# Patient Record
Sex: Male | Born: 1935
Health system: Southern US, Community
[De-identification: ages and names within clinical notes are randomized; demographics above are authoritative.]

## PROBLEM LIST (undated history)

## (undated) DIAGNOSIS — G4733 Obstructive sleep apnea (adult) (pediatric): Secondary | ICD-10-CM

## (undated) DIAGNOSIS — E6609 Other obesity due to excess calories: Secondary | ICD-10-CM

## (undated) HISTORY — DX: Other obesity due to excess calories: E66.09

## (undated) HISTORY — DX: Obstructive sleep apnea (adult) (pediatric): G47.33

## (undated) HISTORY — PX: KNEE SURGERY: SHX244

---

## 2000-01-09 ENCOUNTER — Encounter: Payer: Self-pay | Admitting: Emergency Medicine

## 2000-01-09 ENCOUNTER — Emergency Department (HOSPITAL_COMMUNITY): Admission: EM | Admit: 2000-01-09 | Discharge: 2000-01-09 | Payer: Self-pay | Admitting: Emergency Medicine

## 2000-01-17 ENCOUNTER — Encounter: Payer: Self-pay | Admitting: Orthopedic Surgery

## 2000-01-17 ENCOUNTER — Ambulatory Visit (HOSPITAL_COMMUNITY): Admission: RE | Admit: 2000-01-17 | Discharge: 2000-01-19 | Payer: Self-pay | Admitting: Orthopedic Surgery

## 2001-09-05 ENCOUNTER — Encounter: Admission: RE | Admit: 2001-09-05 | Discharge: 2001-09-05 | Payer: Self-pay | Admitting: Geriatric Medicine

## 2001-09-05 ENCOUNTER — Encounter: Payer: Self-pay | Admitting: Geriatric Medicine

## 2001-09-12 ENCOUNTER — Ambulatory Visit (HOSPITAL_COMMUNITY): Admission: RE | Admit: 2001-09-12 | Discharge: 2001-09-12 | Payer: Self-pay | Admitting: Geriatric Medicine

## 2001-09-12 ENCOUNTER — Encounter: Admission: RE | Admit: 2001-09-12 | Discharge: 2001-09-12 | Payer: Self-pay | Admitting: Geriatric Medicine

## 2001-09-12 ENCOUNTER — Encounter: Payer: Self-pay | Admitting: Geriatric Medicine

## 2003-01-21 ENCOUNTER — Encounter (INDEPENDENT_AMBULATORY_CARE_PROVIDER_SITE_OTHER): Payer: Self-pay | Admitting: Specialist

## 2003-01-21 ENCOUNTER — Ambulatory Visit (HOSPITAL_COMMUNITY): Admission: RE | Admit: 2003-01-21 | Discharge: 2003-01-21 | Payer: Self-pay | Admitting: Gastroenterology

## 2005-05-11 ENCOUNTER — Ambulatory Visit: Payer: Self-pay | Admitting: Internal Medicine

## 2005-08-10 ENCOUNTER — Ambulatory Visit: Payer: Self-pay | Admitting: Internal Medicine

## 2005-12-12 ENCOUNTER — Ambulatory Visit: Payer: Self-pay | Admitting: Internal Medicine

## 2006-01-03 ENCOUNTER — Ambulatory Visit: Payer: Self-pay | Admitting: Internal Medicine

## 2006-03-12 ENCOUNTER — Ambulatory Visit: Payer: Self-pay | Admitting: Internal Medicine

## 2006-11-19 ENCOUNTER — Ambulatory Visit: Payer: Self-pay | Admitting: Internal Medicine

## 2007-01-27 ENCOUNTER — Ambulatory Visit: Payer: Self-pay | Admitting: Internal Medicine

## 2007-01-27 ENCOUNTER — Telehealth (INDEPENDENT_AMBULATORY_CARE_PROVIDER_SITE_OTHER): Payer: Self-pay | Admitting: *Deleted

## 2007-01-27 DIAGNOSIS — G4733 Obstructive sleep apnea (adult) (pediatric): Secondary | ICD-10-CM

## 2007-01-27 DIAGNOSIS — J309 Allergic rhinitis, unspecified: Secondary | ICD-10-CM | POA: Insufficient documentation

## 2007-01-27 DIAGNOSIS — J449 Chronic obstructive pulmonary disease, unspecified: Secondary | ICD-10-CM

## 2007-02-06 ENCOUNTER — Telehealth (INDEPENDENT_AMBULATORY_CARE_PROVIDER_SITE_OTHER): Payer: Self-pay | Admitting: *Deleted

## 2007-05-26 ENCOUNTER — Ambulatory Visit: Payer: Self-pay | Admitting: Internal Medicine

## 2007-08-18 ENCOUNTER — Telehealth (INDEPENDENT_AMBULATORY_CARE_PROVIDER_SITE_OTHER): Payer: Self-pay | Admitting: *Deleted

## 2007-08-20 ENCOUNTER — Telehealth (INDEPENDENT_AMBULATORY_CARE_PROVIDER_SITE_OTHER): Payer: Self-pay | Admitting: *Deleted

## 2007-11-24 ENCOUNTER — Ambulatory Visit: Payer: Self-pay | Admitting: Internal Medicine

## 2007-11-27 ENCOUNTER — Telehealth: Payer: Self-pay | Admitting: Internal Medicine

## 2007-12-24 ENCOUNTER — Encounter: Payer: Self-pay | Admitting: Internal Medicine

## 2008-05-24 ENCOUNTER — Ambulatory Visit: Payer: Self-pay | Admitting: Internal Medicine

## 2008-09-09 ENCOUNTER — Encounter: Payer: Self-pay | Admitting: Internal Medicine

## 2008-09-09 ENCOUNTER — Telehealth: Payer: Self-pay | Admitting: Internal Medicine

## 2008-09-14 ENCOUNTER — Encounter: Payer: Self-pay | Admitting: Internal Medicine

## 2008-11-22 ENCOUNTER — Ambulatory Visit: Payer: Self-pay | Admitting: Internal Medicine

## 2008-11-28 LAB — CONVERTED CEMR LAB: IgE (Immunoglobulin E), Serum: 63.4 intl units/mL (ref 0.0–180.0)

## 2009-02-08 ENCOUNTER — Ambulatory Visit: Payer: Self-pay | Admitting: Internal Medicine

## 2009-03-07 ENCOUNTER — Ambulatory Visit: Payer: Self-pay | Admitting: Internal Medicine

## 2009-03-23 ENCOUNTER — Ambulatory Visit: Payer: Self-pay | Admitting: Internal Medicine

## 2009-05-26 ENCOUNTER — Telehealth: Payer: Self-pay | Admitting: Internal Medicine

## 2009-05-27 ENCOUNTER — Ambulatory Visit: Payer: Self-pay | Admitting: Internal Medicine

## 2009-08-29 HISTORY — PX: COLONOSCOPY: SHX5424

## 2009-09-29 ENCOUNTER — Ambulatory Visit (HOSPITAL_COMMUNITY): Admission: RE | Admit: 2009-09-29 | Discharge: 2009-09-29 | Payer: Self-pay | Admitting: Gastroenterology

## 2009-11-28 ENCOUNTER — Ambulatory Visit: Payer: Self-pay | Admitting: Internal Medicine

## 2010-02-23 NOTE — Assessment & Plan Note (Signed)
Summary: NP follow up - hemoptysis   Primary Provider/Referring Provider:  Stoneking  CC:  chronic chest congestion and had prod cough with dark red sputum x1 episode 5days ago.  no episodes since.  History of Present Illness: 11/24/07-  Sleep apnea control "doing fine" on BiPAP. Compliant. Working with Christoper Allegra for new mask. Asthma- Dr [name] Rx'd mid summer. Then patient took his own steroid taper last week. Has incr'd to Advair 500. Using systemic steroid 3-4 x/ year.  05/24/08- Asthma, OSA, allergic rhinitis Heavy spring pollen noted, but managing and hasn't needed rescue inhaler lately. [employer] insurance sending him to an exercise/weight program. Takes a prednisone taper usually about once per quarter. Reviewed steroid side effects. Uses Advair 250 once daily. Coughs some white occasionally. rhinorhea.  November 22, 2008- Asthma, OSA, Allergic rhinitis Using BiPAP 10/3, [facility]. He says this is fine and he uses it every night. Comes for PFT review: Normal flows with some airtrapping Fev1/FVC 0.72.  He still goes to [facility] several times/ week. He continues using prednisone taper about once/ quarter in addition to the Advair.   February 08, 2009--Presents for an acute office visit. Complains of chronic chest congestion, had prod cough with dark red sputum x1 episode 5days ago.  Has had no episodes since first episode. Complains of years of aggravating cough, --mainly dry in nature. Has worsened over last week. Cough is minimally productive w/ no discolored mucus. No fever. Does not feel bad, just getting worn out with cough. Denies chest pain,  orthopnea,   fever, n/v/d, edema, headache,recent travel or antibiotics.    Medications Prior to Update: 1)  Lisinopril 20 Mg  Tabs (Lisinopril) .... Take 1 Tablet By Mouth Once A Day 2)  Simvastatin 80 Mg Tabs (Simvastatin) .... Take 1 By Mouth Once Daily 3)  Bayer Low Strength 81 Mg  Tbec (Aspirin) .... Take 1 Tablet By Mouth Once A Day 4)  Flonase 50  Mcg/act  Susp (Fluticasone Propionate) .Marland Kitchen.. 1 Spray Each Nostril Once Daily 5)  Advair Diskus 500-50 Mcg/dose Misc (Fluticasone-Salmeterol) .... Inhale 1 Puff Two Times A Day Rinse Mouth Well 6)  Prednisone 5 Mg  Tabs (Prednisone) .... Burst and Taper As Directed Prn 7)  Bipap [employer]  10/3 8)  Proair Hfa 108 (90 Base) Mcg/act Aers (Albuterol Sulfate) .... 2 Puffs and Rinse Four Times A Day As Needed 9)  Amlodipine Besylate 10 Mg Tabs (Amlodipine Besylate) .... Take 1 By Mouth Once Daily 10)  Furosemide 20 Mg Tabs (Furosemide) .... Once Daily  Current Medications (verified): 1)  Lisinopril 20 Mg  Tabs (Lisinopril) .... Take 1 Tablet By Mouth Once A Day 2)  Simvastatin 80 Mg Tabs (Simvastatin) .... Take 1 By Mouth Once Daily 3)  Bayer Low Strength 81 Mg  Tbec (Aspirin) .... Take 1 Tablet By Mouth Once A Day 4)  Flonase 50 Mcg/act  Susp (Fluticasone Propionate) .Marland Kitchen.. 1 Spray Each Nostril Once Daily 5)  Advair Diskus 500-50 Mcg/dose Misc (Fluticasone-Salmeterol) .... Inhale 1 Puff Two Times A Day Rinse Mouth Well 6)  Bipap [employer]  10/3 .... Wear At Bedtime 7)  Proair Hfa 108 (90 Base) Mcg/act Aers (Albuterol Sulfate) .... 2 Puffs and Rinse Four Times A Day As Needed 8)  Amlodipine Besylate 10 Mg Tabs (Amlodipine Besylate) .... Take 1 By Mouth Once Daily 9)  Furosemide 20 Mg Tabs (Furosemide) .... Once Daily 10)  Omega-3 Fish Oil 1200 Mg Caps (Omega-3 Fatty Acids) .... Take 1 Capsule By Mouth Three Times A  Day  Allergies (verified): No Known Drug Allergies  Past History:  Past Medical History: Last updated: 01/27/2007 OSA- NPSG 03/03/96 AHI 50/hr allergic rhinitis asthma- PFT 01/03/06:  FEV1/FVC .70, small airway slowing with response to bronchodilator exogenous obesity  Past Surgical History: Last updated: 05/24/2008 Right knee surgery  Family History: Last updated: 02/08/2009 asthma - MGF rheumatism - PGM cancer - pat uncle (skin) renal insuf - mother  Social History: Last  updated: 02/08/2009 former smoker, quit in 1985 x72yrs, 2-3ppd. alcohol: 2 beers, 8oz scotch daily divorced 2 children, 5 grandchildren retired: Armed forces operational officer with [employer]  Risk Factors: Smoking Status: quit (05/26/2007)  Family History: asthma - MGF rheumatism - PGM cancer - pat uncle (skin) renal insuf - mother  Social History: former smoker, quit in 1985 x55yrs, 2-3ppd. alcohol: 2 beers, 8oz scotch daily divorced 2 children, 5 grandchildren retired: Armed forces operational officer with [employer]  Review of Systems      See HPI  Vital Signs:  Patient profile:   76 year old male Height:      68 inches Weight:      326.50 pounds BMI:     49.82 O2 Sat:      97 % on Room air Temp:     99.3 degrees F oral Pulse rate:   68 / minute BP sitting:   136 / 74  (left arm) Cuff size:   large  Vitals Entered By: Boone Master CNA (February 08, 2009 10:23 AM)  O2 Flow:  Room air CC: chronic chest congestion, had prod cough with dark red sputum x1 episode 5days ago.  no episodes since Is Patient Diabetic? No Comments Medications reviewed with patient Daytime contact number verified with patient. Boone Master CNA  February 08, 2009 10:23 AM    Physical Exam  Additional Exam:  General: A/Ox3; pleasant and cooperative, NAD, obese SKIN: early stasis rash right pretibial ankles NODES: no lymphadenopathy HEENT: [place]/AT, EOM- WNL, Conjuctivae- clear, PERRLA, TM-WNL, Nose- clear, Throat- clear and wnl NECK: Supple w/ fair ROM, JVD- none, normal carotid impulses w/o bruits Thyroid-  CHEST: Coarse BS w/ upper airway wheeizng.  HEART: RRR, no m/g/r heard ABDOMEN: EAV:WUJW, nl pulses, no edema  NEURO: Grossly intact to observation      Impression & Recommendations:  Problem # 1:  ASTHMA (ICD-493.90) Flare with upper airway cough /inflammation  ?secondary to ACE, vs reflux vs rhinitis.  REC: xray pending.  Stop Lisinopril.  Begin Diovan 320mg  once daily  Delsym 2 tsp twice  daily for cough Tessalon three times a day as needed cough Begin Omeprazole 20mg  once daily before meal.  Hold fish oil for now.  Begin Zyrtec 10mg  at bedtime  Prednisone taper over next week.  I will call with xray results.  Please contact office for sooner follow up if symptoms do not improve or worsen  follow up 4 weeks.   Medications Added to Medication List This Visit: 1)  Diovan 320 Mg Tabs (Valsartan) .Marland Kitchen.. 1 by mouth once daily 2)  Bipap [employer] 10/3  .... Wear at bedtime 3)  Omega-3 Fish Oil 1200 Mg Caps (Omega-3 fatty acids) .... Take 1 capsule by mouth three times a day 4)  Tessalon 200 Mg Caps (Benzonatate) .Marland Kitchen.. 1 by mouth three times a day as needed cough 5)  Prednisone 10 Mg Tabs (Prednisone) .... 4 tabs for 2 days, then 3 tabs for 2 days, 2 tabs for 2 days, then 1 tab for 2 days, then stop  Complete Medication List: 1)  Diovan 320 Mg Tabs (Valsartan) .Marland Kitchen.. 1 by mouth once daily 2)  Simvastatin 80 Mg Tabs (Simvastatin) .... Take 1 by mouth once daily 3)  Bayer Low Strength 81 Mg Tbec (Aspirin) .... Take 1 tablet by mouth once a day 4)  Flonase 50 Mcg/act Susp (Fluticasone propionate) .Marland Kitchen.. 1 spray each nostril once daily 5)  Advair Diskus 500-50 Mcg/dose Misc (Fluticasone-salmeterol) .... Inhale 1 puff two times a day rinse mouth well 6)  Bipap [employer] 10/3  .... Wear at bedtime 7)  Proair Hfa 108 (90 Base) Mcg/act Aers (Albuterol sulfate) .... 2 puffs and rinse four times a day as needed 8)  Amlodipine Besylate 10 Mg Tabs (Amlodipine besylate) .... Take 1 by mouth once daily 9)  Furosemide 20 Mg Tabs (Furosemide) .... Once daily 10)  Omega-3 Fish Oil 1200 Mg Caps (Omega-3 fatty acids) .... Take 1 capsule by mouth three times a day 11)  Tessalon 200 Mg Caps (Benzonatate) .Marland Kitchen.. 1 by mouth three times a day as needed cough 12)  Prednisone 10 Mg Tabs (Prednisone) .... 4 tabs for 2 days, then 3 tabs for 2 days, 2 tabs for 2 days, then 1 tab for 2 days, then stop  Other Orders: T-2  View CXR (71020TC) Est. Patient Level IV (45409)  Patient Instructions: 1)  Stop Lisinopril.  2)  Begin Diovan 320mg  once daily  3)  Delsym 2 tsp twice daily for cough 4)  Tessalon three times a day as needed cough 5)  Begin Omeprazole 20mg  once daily before meal.  6)  Hold fish oil for now.  7)  Begin Zyrtec 10mg  at bedtime  8)  Prednisone taper over next week.  9)  I will call with xray results.  10)  Please contact office for sooner follow up if symptoms do not improve or worsen  11)  follow up 4 weeks.  Prescriptions: PREDNISONE 10 MG TABS (PREDNISONE) 4 tabs for 2 days, then 3 tabs for 2 days, 2 tabs for 2 days, then 1 tab for 2 days, then stop  #20 x 0   Entered and Authorized by:   Rubye Oaks NP   Signed by:   Rubye Oaks NP on 02/08/2009   Method used:   Electronically to        [facility]  [phone]* (retail)       [address] [place], [place]  [address]       Ph: [phone] or [phone]       Fax: [phone]   RxID:   [id number] TESSALON 200 MG CAPS (BENZONATATE) 1 by mouth three times a day as needed cough  #45 x 1   Entered and Authorized by:   Rubye Oaks NP   Signed by:   Rubye Oaks NP on 02/08/2009   Method used:   Electronically to        [facility]  [phone]* (retail)       [address] [place], [place]  [address]       Ph: [phone] or [phone]       Fax: [phone]   RxID:   [id number]

## 2010-02-23 NOTE — Assessment & Plan Note (Signed)
Summary: rov 4 months///kp   Primary Provider/Referring Provider:  [name]  CC:  follow up visit-still wheezing.  History of Present Illness: November 22, 2008- Asthma, OSA, Allergic rhinitis Using BiPAP 10/3, [facility]. He says this is fine and he uses it every night. Comes for PFT review: Normal flows with some airtrapping Fev1/FVC 0.72.  He still goes to [facility] several times/ week. He continues using prednisone taper about once/ quarter in addition to the Advair.   February 08, 2009--Presents for an acute office visit. Complains of chronic chest congestion, had prod cough with dark red sputum x1 episode 5days ago.  Has had no episodes since first episode. Complains of years of aggravating cough, --mainly dry in nature. Has worsened over last week. Cough is minimally productive w/ no discolored mucus. No fever. Does not feel bad, just getting worn out with cough.    March 07, 2009 --1 month follow up, states no longer has the dark red sputum, and has been doing well with the diovan but BPs still elevated.  prod cough with yellow mucus, wheezing, dyspnea x1week Last visit ace stopped w/ cough prevention. CXR last visit w/ chronic changes. cough is better but not completely gone aware and hoarseness. Feels like scratchy throat. Denies chest pain,  orthopnea, hemoptysis, fever,   March 23, 2009- Asthma, OSA, Allergic rhinitis Still less cough since quitting lisinopril. No more blood. Also switched from Advair to Symbicort to avoid the dry powder,which he likes a little better. He finds omeprazole replaces Tums. He doesn't wake choking or strangled and denies choking with food or drink. Still notes some raspy cough from trachea, occasionally productive of yellow. Prednisone worked and almost cleared him . Tessalon caused drowsiness. Uses BIPAP 10/3 every night. PFT reviewed from last Fall- supporting impression of asthma. CXR showed only some hyperinflation despite his obesity- also supporting  asthma.   Current Medications (verified): 1)  Diovan 320 Mg Tabs (Valsartan) .Marland Kitchen.. 1 By Mouth Once Daily 2)  Simvastatin 80 Mg Tabs (Simvastatin) .... Take 1 By Mouth Once Daily 3)  Bayer Low Strength 81 Mg  Tbec (Aspirin) .... Take 1 Tablet By Mouth Once A Day 4)  Flonase 50 Mcg/act  Susp (Fluticasone Propionate) .Marland Kitchen.. 1 Spray Each Nostril Once Daily 5)  Symbicort 160-4.5 Mcg/act Aero (Budesonide-Formoterol Fumarate) .... 2 Puffs Two Times A Day and Rinse 6)  Bipap [employer]  10/3 .... Wear At Bedtime 7)  Proair Hfa 108 (90 Base) Mcg/act Aers (Albuterol Sulfate) .... 2 Puffs and Rinse Four Times A Day As Needed 8)  Amlodipine Besylate 10 Mg Tabs (Amlodipine Besylate) .... Take 1 By Mouth Once Daily 9)  Furosemide 20 Mg Tabs (Furosemide) .... Once Daily  Allergies (verified): No Known Drug Allergies  Past History:  Past Medical History: Last updated: 01/27/2007 OSA- NPSG 03/03/96 AHI 50/hr allergic rhinitis asthma- PFT 01/03/06:  FEV1/FVC .70, small airway slowing with response to bronchodilator exogenous obesity  Past Surgical History: Last updated: 05/24/2008 Right knee surgery  Family History: Last updated: 02/08/2009 asthma - MGF rheumatism - PGM cancer - pat uncle (skin) renal insuf - mother  Social History: Last updated: 02/08/2009 former smoker, quit in 1985 x17yrs, 2-3ppd. alcohol: 2 beers, 8oz scotch daily divorced 2 children, 5 grandchildren retired: Armed forces operational officer with [employer]  Risk Factors: Smoking Status: quit (05/26/2007)  Review of Systems      See HPI       The patient complains of hoarseness, dyspnea on exertion, and prolonged cough.  The patient  denies anorexia, fever, weight loss, weight gain, vision loss, decreased hearing, chest pain, syncope, headaches, hemoptysis, abdominal pain, and severe indigestion/heartburn.    Vital Signs:  Patient profile:   76 year old male Height:      68 inches Weight:      330.25 pounds BMI:     50.40 O2  Sat:      96 % on Room air Pulse rate:   70 / minute BP sitting:   142 / 88  (left arm) Cuff size:   large  Vitals Entered By: Reynaldo Minium CMA (March 23, 2009 9:27 AM)  O2 Flow:  Room air  Physical Exam  Additional Exam:  General: A/Ox3; pleasant and cooperative, NAD, obese SKIN: early stasis rash right pretibial ankles NODES: no lymphadenopathy HEENT: [place]/AT, EOM- WNL, Conjuctivae- clear, PERRLA, TM-WNL, Nose- clear, Throat- clear and wnl, Mallampati  III NECK: Supple w/ fair ROM, JVD- none, normal carotid impulses w/o bruits Thyroid-  CHEST: Coarse BS w/ upper airway wheezing.  HEART: RRR, no m/g/r heard ABDOMEN:soft, obese GNF:AOZH, nl pulses, no edema  NEURO: Grossly intact to observation      Impression & Recommendations:  Problem # 1:  SLEEP APNEA (ICD-780.57)  Good compliance and control on CPAP  Problem # 2:  ASTHMA (ICD-493.90) Available studies favor asthma with coarse wheeze on exam today. Mild osteopenia suggested on CXR and discused with him in connection with steroid use. We would like to boost his inhaled steroid for a while. I will give a sample of Dulera 200/5 for trial. We can also retry Singulair or add theophylline or a second inhaled steroid.  Medications Added to Medication List This Visit: 1)  Symbicort 160-4.5 Mcg/act Aero (Budesonide-formoterol fumarate) .... 2 puffs two times a day and rinse  Other Orders: Est. Patient Level III (08657)  Patient Instructions: 1)  Please schedule a follow-up appointment in 2 months. 2)  Try sample Dulera 200/5 2 puffs and rinse mouth well, twice daily. Compare this to your experience with Advair or Symbicort. Check pricing coverage with your insurance.

## 2010-02-23 NOTE — Assessment & Plan Note (Signed)
Summary: 6 months/ mbw   Primary [name] [name]/Referring Fionn Stracke:  Stoneking  CC:  6 month follow up visit-sleep apnea; Using BIPAP each night and "pretty well"..  History of Present Illness: March 07, 2009 --1 month follow up, states no longer has the dark red sputum, and has been doing well with the diovan but BPs still elevated.  prod cough with yellow mucus, wheezing, dyspnea x1week Last visit ace stopped w/ cough prevention. CXR last visit w/ chronic changes. cough is better but not completely gone aware and hoarseness. Feels like scratchy throat. Denies chest pain,  orthopnea, hemoptysis, fever,   March 23, 2009- Asthma, OSA, Allergic rhinitis Still less cough since quitting lisinopril. No more blood. Also switched from Advair to Symbicort to avoid the dry powder,which he likes a little better. He finds omeprazole replaces Tums. He doesn't wake choking or strangled and denies choking with food or drink. Still notes some raspy cough from trachea, occasionally productive of yellow. Prednisone worked and almost cleared him . Tessalon caused drowsiness. Uses BIPAP 10/3 every night. PFT reviewed from last Fall- supporting impression of asthma. CXR showed only some hyperinflation despite his obesity- also supporting asthma.  November 28, 2009- Asthma, OSA, Allergic rhinitis Nurse-CC: 6 month follow up visit-sleep apnea; Using BIPAP each night and "pretty well". Continues doing well with Bipap 10/3. He plans to ask [facility] to check the machine. Occasionally allows himself a nap in chair w/o CPAP and noices the difference in how he feels.  Asthma- starting to notice some cough  and likes Symbicort, but notices that he has  been coughing more lately. Had flu vax last year, pneumovax last winter. Not using Proair more than every few months.      Asthma History    Initial Asthma Severity Rating:    Age range: 12+ years    Symptoms: >2 days/week; not daily    Nighttime Awakenings: 0-2/month  Interferes w/ normal activity: no limitations    SABA use (not for EIB): 0-2 days/week    Asthma Severity Assessment: Mild Persistent   Preventive Screening-Counseling & Management  Alcohol-Tobacco     Smoking Status: quit     Year Quit: 1981     Pack years: 40 years 2 packs daily  Current Medications (verified): 1)  Diovan 320 Mg Tabs (Valsartan) .Marland Kitchen.. 1 By Mouth Once Daily 2)  Lipitor 40 Mg Tabs (Atorvastatin Calcium) .... Take 1 By Mouth Once Daily 3)  Bayer Low Strength 81 Mg  Tbec (Aspirin) .... Take 1 Tablet By Mouth Once A Day 4)  Flonase 50 Mcg/act  Susp (Fluticasone Propionate) .Marland Kitchen.. 1 Spray Each Nostril Once Daily 5)  Symbicort 80-4.5 Mcg/act Aero (Budesonide-Formoterol Fumarate) .... 2 Puffs and Rinse Mouth, Twice Daily 6)  Bipap [employer]  10/3 .... Wear At Bedtime 7)  Proair Hfa 108 (90 Base) Mcg/act Aers (Albuterol Sulfate) .... 2 Puffs and Rinse Four Times A Day As Needed 8)  Amlodipine Besylate 10 Mg Tabs (Amlodipine Besylate) .... Take 1 By Mouth Once Daily 9)  Furosemide 20 Mg Tabs (Furosemide) .... Once Daily 10)  Aleve 220 Mg Tabs (Naproxen Sodium) .... As Needed  Allergies (verified): No Known Drug Allergies  Past History:  Past Medical History: Last updated: 01/27/2007 OSA- NPSG 03/03/96 AHI 50/hr allergic rhinitis asthma- PFT 01/03/06:  FEV1/FVC .70, small airway slowing with response to bronchodilator exogenous obesity  Past Surgical History: Last updated: 05/24/2008 Right knee surgery  Family History: Last updated: 02/08/2009 asthma - MGF rheumatism - PGM cancer -  pat uncle (skin) renal insuf - mother  Social History: Last updated: 02/08/2009 former smoker, quit in 1985 x79yrs, 2-3ppd. alcohol: 2 beers, 8oz scotch daily divorced 2 children, 5 grandchildren retired: Armed forces operational officer with [employer]  Risk Factors: Smoking Status: quit (11/28/2009)  Review of Systems      See HPI       The patient complains of shortness of breath with  activity and productive cough.  The patient denies shortness of breath at rest, non-productive cough, coughing up blood, chest pain, irregular heartbeats, acid heartburn, indigestion, loss of appetite, weight change, abdominal pain, difficulty swallowing, sore throat, tooth/dental problems, headaches, nasal congestion/difficulty breathing through nose, sneezing, itching, hand/feet swelling, joint stiffness or pain, rash, change in color of mucus, and fever.    Vital Signs:  Patient profile:   76 year old male Height:      68 inches Weight:      319.13 pounds BMI:     48.70 O2 Sat:      95 % on Room air Pulse rate:   91 / minute BP sitting:   164 / 84  (left arm) Cuff size:   large  Vitals Entered By: Reynaldo Minium CMA (November 28, 2009 9:18 AM)  O2 Flow:  Room air CC: 6 month follow up visit-sleep apnea; Using BIPAP each night and "pretty well".   Physical Exam  Additional Exam:  General: A/Ox3; pleasant and cooperative, NAD,  morbidly obese SKIN: early stasis rash right pretibial ankles NODES: no lymphadenopathy HEENT: [name], EOM- WNL, Conjuctivae- clear, PERRLA, TM-WNL, Nose- clear, Throat- clear and wnl, Mallampati  III NECK: Supple w/ fair ROM, JVD- none, normal carotid impulses w/o bruits Thyroid-  CHEST: Coarse BS w/ trace upper airway wheezing. Unlabored. HEART: RRR, no m/g/r heard ABDOMEN:soft, obese WUJ:WJXB, nl pulses, no edema  NEURO: Grossly intact to observation      Impression & Recommendations:  Problem # 1:  ASTHMA (ICD-493.90) He likes Symbicort, but I would like better airway control.  Will try switching to the 160 strength as discussed. We reviewed current meds.   Problem # 2:  SLEEP APNEA (ICD-780.57)  Good compliance and control with his BiPAP. Marland Kitchen Weight loss is needed.   Medications Added to Medication List This Visit: 1)  Lipitor 40 Mg Tabs (Atorvastatin calcium) .... Take 1 by mouth once daily 2)  Symbicort 160-4.5 Mcg/act Aero  (Budesonide-formoterol fumarate) .... 2 puffs and rinse mouth twice daily  Other Orders: Est. Patient Level IV (14782)  Patient Instructions: 1)  Please schedule a follow-up appointment in 6 months. 2)  Sample/ script to change Symbicort to the 160/4.5 strength.  3)    2 puffs and rinse mouth, twice daily. It may help to use this before bmeals so that eating helps clear the medicine from your mouth.  4)  Flu vax Prescriptions: SYMBICORT 160-4.5 MCG/ACT AERO (BUDESONIDE-FORMOTEROL FUMARATE) 2 puffs and rinse mouth twice daily  #1 x prn   Entered and Authorized by:   Waymon Budge MD   Signed by:   Waymon Budge MD on 11/28/2009   Method used:   Print then Give to Patient   RxID:   [id number]   Appended Document: Orders Update-FLU SHOT    Clinical Lists Changes  Orders: Added new Service order of Influenza Vaccine MCR [id number]) - Signed Observations: Added new observation of FLU VAX#1VIS: 08/16/09 version given November 28, 2009. (11/28/2009 15:53) Added new observation of FLU VAXLOT: 11133P (11/28/2009 15:53) Added new observation of  FLU VAX EXP: 06/23/2010 (11/28/2009 15:53) Added new observation of FLU VAXBY: Reynaldo Minium CMA (11/28/2009 15:53) Added new observation of FLU VAXRTE: IM (11/28/2009 15:53) Added new observation of FLU VAX DSE: 0.5 ml (11/28/2009 15:53) Added new observation of FLU VAXMFR: [employer] (11/28/2009 15:53) Added new observation of FLU VAX SITE: left deltoid (11/28/2009 15:53) Added new observation of FLU VAX: Fluvax MCR (11/28/2009 15:53)       Immunizations Administered:  Influenza Vaccine # 1:    Vaccine Type: Fluvax MCR    Site: left deltoid    Mfr: Novartis    Dose: 0.5 ml    Route: IM    Given by: Reynaldo Minium CMA    Exp. Date: 06/23/2010    Lot #: [id number]    VIS given: 08/16/09 version given November 28, 2009.  Flu Vaccine Consent Questions:    Do you have a history of severe allergic reactions to this vaccine? no    Any  prior history of allergic reactions to egg and/or gelatin? no    Do you have a sensitivity to the preservative Thimersol? no    Do you have a past history of Guillan-Barre Syndrome? no    Do you currently have an acute febrile illness? no    Have you ever had a severe reaction to latex? no    Vaccine information given and explained to patient? yes

## 2010-02-23 NOTE — Progress Notes (Signed)
Summary: rsc nos appt  Phone Note Call from Patient   Caller: [email or web address]  Call For: [name] Summary of Call: Rsc 5/4 nos to 5/6 @ 9:15a. Initial call taken by: Darletta Moll,  May 26, 2009 10:26 AM

## 2010-02-23 NOTE — Assessment & Plan Note (Signed)
Summary: 76m reck/klw   Primary Provider/Referring Provider:  [name]  CC:  2 month asthma follow up.  Pt states breathing is still "on and off."  States he is still having wheezing "on and off."  States he did not see any change in breathing with Dulera.  Pt restarted Symbicort after finishing Dulera sample.  [name] [name]  History of Present Illness: February 08, 2009--Presents for an acute office visit. Complains of chronic chest congestion, had prod cough with dark red sputum x1 episode 5days ago.  Has had no episodes since first episode. Complains of years of aggravating cough, --mainly dry in nature. Has worsened over last week. Cough is minimally productive w/ no discolored mucus. No fever. Does not feel bad, just getting worn out with cough.    March 07, 2009 --1 month follow up, states no longer has the dark red sputum, and has been doing well with the diovan but BPs still elevated.  prod cough with yellow mucus, wheezing, dyspnea x1week Last visit ace stopped w/ cough prevention. CXR last visit w/ chronic changes. cough is better but not completely gone aware and hoarseness. Feels like scratchy throat. Denies chest pain,  orthopnea, hemoptysis, fever,   March 23, 2009- Asthma, OSA, Allergic rhinitis Still less cough since quitting lisinopril. No more blood. Also switched from Advair to Symbicort to avoid the dry powder,which he likes a little better. He finds omeprazole replaces Tums. He doesn't wake choking or strangled and denies choking with food or drink. Still notes some raspy cough from trachea, occasionally productive of yellow. Prednisone worked and almost cleared him . Tessalon caused drowsiness. Uses BIPAP 10/3 every night. PFT reviewed from last Fall- supporting impression of asthma. CXR showed only some hyperinflation despite his obesity- also supporting asthma.  May 27, 2009- Asthma, OSA, Allergic rhinitis He is holding fairly well. He started "juicing" for weight and diet  management. Used Elwin Sleight, seeing little difference from Symbicort and asking Rx for Symbicort. Legs swell some despite furosemide- we discussed role of his calcium channel blocker. Zyrtec helps a lot- lets him sleep better.      Current Medications (verified): 1)  Diovan 320 Mg Tabs (Valsartan) .[name] [name].. 1 By Mouth Once Daily 2)  Simvastatin 80 Mg Tabs (Simvastatin) .... Take 1 By Mouth Once Daily 3)  Bayer Low Strength 81 Mg  Tbec (Aspirin) .... Take 1 Tablet By Mouth Once A Day 4)  Flonase 50 Mcg/act  Susp (Fluticasone Propionate) .[name] [name].. 1 Spray Each Nostril Once Daily 5)  Symbicort 160-4.5 Mcg/act Aero (Budesonide-Formoterol Fumarate) .... 2 Puffs Two Times A Day and Rinse 6)  Bipap [employer]  10/3 .... Wear At Bedtime 7)  Proair Hfa 108 (90 Base) Mcg/act Aers (Albuterol Sulfate) .... 2 Puffs and Rinse Four Times A Day As Needed 8)  Amlodipine Besylate 10 Mg Tabs (Amlodipine Besylate) .... Take 1 By Mouth Once Daily 9)  Furosemide 20 Mg Tabs (Furosemide) .... Once Daily 10)  Aleve 220 Mg Tabs (Naproxen Sodium) .... As Needed  Allergies (verified): No Known Drug Allergies  Past History:  Past Medical History: Last updated: 01/27/2007 OSA- NPSG 03/03/96 AHI 50/hr allergic rhinitis asthma- PFT 01/03/06:  FEV1/FVC .70, small airway slowing with response to bronchodilator exogenous obesity  Past Surgical History: Last updated: 05/24/2008 Right knee surgery  Family History: Last updated: 02/08/2009 asthma - MGF rheumatism - PGM cancer - pat uncle (skin) renal insuf - mother  Social History: Last updated: 02/08/2009 former smoker, quit in 1985 x65yrs, 2-3ppd. alcohol: 2  beers, 8oz scotch daily divorced 2 children, 5 grandchildren retired: Armed forces operational officer with [employer]  Risk Factors: Smoking Status: quit (05/26/2007)  Review of Systems      See HPI       The patient complains of dyspnea on exertion and peripheral edema.  The patient denies anorexia, fever, weight loss,  weight gain, vision loss, decreased hearing, hoarseness, chest pain, syncope, prolonged cough, headaches, hemoptysis, abdominal pain, and severe indigestion/heartburn.    Vital Signs:  Patient profile:   76 year old male Height:      68 inches Weight:      319 pounds BMI:     48.68 O2 Sat:      97 % on Room air Pulse rate:   80 / minute BP sitting:   140 / 78  (right arm) Cuff size:   large  Vitals Entered By: Gweneth Dimitri RN (May 27, 2009 9:28 AM)  O2 Flow:  Room air CC: 2 month asthma follow up.  Pt states breathing is still "on and off."  States he is still having wheezing "on and off."  States he did not see any change in breathing with Dulera.  Pt restarted Symbicort after finishing Dulera sample.   Comments Medications reviewed with patient Daytime contact number verified with patient. Gweneth Dimitri RN  May 27, 2009 9:28 AM    Physical Exam  Additional Exam:  General: A/Ox3; pleasant and cooperative, NAD,  morbidly obese SKIN: early stasis rash right pretibial ankles NODES: no lymphadenopathy HEENT: [place]/AT, EOM- WNL, Conjuctivae- clear, PERRLA, TM-WNL, Nose- clear, Throat- clear and wnl, Mallampati  III NECK: Supple w/ fair ROM, JVD- none, normal carotid impulses w/o bruits Thyroid-  CHEST: Coarse BS w/ trace upper airway wheezing.  HEART: RRR, no m/g/r heard ABDOMEN:soft, obese ZOX:WRUE, nl pulses, no edema  NEURO: Grossly intact to observation      Impression & Recommendations:  Problem # 1:  SLEEP APNEA (ICD-780.57)  Does well with BiPAP at 10/3 - fully compliant and doing well.  Problem # 2:  ASTHMA (ICD-493.90) I think we can try reducing Symbicort to 80/4.5 with cost in mind. He is given sample Proair rescue inhaler. Obesity with hypoventilation is a significant part of his respiratory limitation. Peripheral edema is multifactorial but aggravated by his weight.  Problem # 3:  ALLERGIC RHINITIS (ICD-477.9)  Zyrtec is sufficient for now. His updated  medication list for this problem includes:    Flonase 50 Mcg/act Susp (Fluticasone propionate) .[name] [name]... 1 spray each nostril once daily  Orders: Est. Patient Level III (45409)  Medications Added to Medication List This Visit: 1)  Symbicort 80-4.5 Mcg/act Aero (Budesonide-formoterol fumarate) .... 2 puffs and rinse mouth, twice daily 2)  Aleve 220 Mg Tabs (Naproxen sodium) .... As needed  Patient Instructions: 1)  Please schedule a follow-up appointment in 6 months. 2)  Scripts for [employer] and Symbicort 80/4.5 Prescriptions: SYMBICORT 80-4.5 MCG/ACT AERO (BUDESONIDE-FORMOTEROL FUMARATE) 2 puffs and rinse mouth, twice daily  #1 x prn   Entered and Authorized by:   Waymon Budge MD   Signed by:   Waymon Budge MD on 05/27/2009   Method used:   Print then Give to Patient   RxID:   [id number] PROAIR HFA 108 (90 BASE) MCG/ACT AERS (ALBUTEROL SULFATE) 2 puffs and rinse four times a day as needed  #1 x prn   Entered and Authorized by:   Waymon Budge MD   Signed by:   Waymon Budge  MD on 05/27/2009   Method used:   Print then Give to Patient   RxID:   [id number]

## 2010-02-23 NOTE — Assessment & Plan Note (Signed)
Summary: NP follow up - asthma   Primary Provider/Referring Provider:  Stoneking  CC:  1 month follow up, states no longer has the dark red sputum, and has been doing well with the diovan but BPs still elevated.  prod cough with yellow mucus, wheezing, and dyspnea x1week - denies f/c/s.  History of Present Illness: 11/24/07-  Sleep apnea control "doing fine" on BiPAP. Compliant. Working with Christoper Allegra for new mask. Asthma- Dr [name] Rx'd mid summer. Then patient took his own steroid taper last week. Has incr'd to Advair 500. Using systemic steroid 3-4 x/ year.  05/24/08- Asthma, OSA, allergic rhinitis Heavy spring pollen noted, but managing and hasn't needed rescue inhaler lately. [employer] insurance sending him to an exercise/weight program. Takes a prednisone taper usually about once per quarter. Reviewed steroid side effects. Uses Advair 250 once daily. Coughs some white occasionally. rhinorhea.  November 22, 2008- Asthma, OSA, Allergic rhinitis Using BiPAP 10/3, [employer]. He says this is fine and he uses it every night. Comes for PFT review: Normal flows with some airtrapping Fev1/FVC 0.72.  He still goes to [facility] several times/ week. He continues using prednisone taper about once/ quarter in addition to the Advair.   February 08, 2009--Presents for an acute office visit. Complains of chronic chest congestion, had prod cough with dark red sputum x1 episode 5days ago.  Has had no episodes since first episode. Complains of years of aggravating cough, --mainly dry in nature. Has worsened over last week. Cough is minimally productive w/ no discolored mucus. No fever. Does not feel bad, just getting worn out with cough.    March 07, 2009 --1 month follow up, states no longer has the dark red sputum, and has been doing well with the diovan but BPs still elevated.  prod cough with yellow mucus, wheezing, dyspnea x1week Last visit ace stopped w/ cough prevention. CXR last visit w/ chronic changes. cough  is better but not completely gone aware and hoarseness. Feels like scratchy throat. Denies chest pain,  orthopnea, hemoptysis, fever, n/v/d, edema, headache.   Medications Prior to Update: 1)  Diovan 320 Mg Tabs (Valsartan) .Marland Kitchen.. 1 By Mouth Once Daily 2)  Simvastatin 80 Mg Tabs (Simvastatin) .... Take 1 By Mouth Once Daily 3)  Bayer Low Strength 81 Mg  Tbec (Aspirin) .... Take 1 Tablet By Mouth Once A Day 4)  Flonase 50 Mcg/act  Susp (Fluticasone Propionate) .Marland Kitchen.. 1 Spray Each Nostril Once Daily 5)  Advair Diskus 500-50 Mcg/dose Misc (Fluticasone-Salmeterol) .... Inhale 1 Puff Two Times A Day Rinse Mouth Well 6)  Bipap [employer]  10/3 .... Wear At Bedtime 7)  Proair Hfa 108 (90 Base) Mcg/act Aers (Albuterol Sulfate) .... 2 Puffs and Rinse Four Times A Day As Needed 8)  Amlodipine Besylate 10 Mg Tabs (Amlodipine Besylate) .... Take 1 By Mouth Once Daily 9)  Furosemide 20 Mg Tabs (Furosemide) .... Once Daily 10)  Omega-3 Fish Oil 1200 Mg Caps (Omega-3 Fatty Acids) .... Take 1 Capsule By Mouth Three Times A Day 11)  Tessalon 200 Mg Caps (Benzonatate) .Marland Kitchen.. 1 By Mouth Three Times A Day As Needed Cough 12)  Prednisone 10 Mg Tabs (Prednisone) .... 4 Tabs For 2 Days, Then 3 Tabs For 2 Days, 2 Tabs For 2 Days, Then 1 Tab For 2 Days, Then Stop  Current Medications (verified): 1)  Diovan 320 Mg Tabs (Valsartan) .Marland Kitchen.. 1 By Mouth Once Daily 2)  Simvastatin 80 Mg Tabs (Simvastatin) .... Take 1 By Mouth Once Daily 3)  Bayer Low Strength 81 Mg  Tbec (Aspirin) .... Take 1 Tablet By Mouth Once A Day 4)  Flonase 50 Mcg/act  Susp (Fluticasone Propionate) .Marland Kitchen.. 1 Spray Each Nostril Once Daily 5)  Advair Diskus 500-50 Mcg/dose Misc (Fluticasone-Salmeterol) .... Inhale 1 Puff Two Times A Day Rinse Mouth Well 6)  Bipap [employer]  10/3 .... Wear At Bedtime 7)  Proair Hfa 108 (90 Base) Mcg/act Aers (Albuterol Sulfate) .... 2 Puffs and Rinse Four Times A Day As Needed 8)  Amlodipine Besylate 10 Mg Tabs (Amlodipine Besylate) ....  Take 1 By Mouth Once Daily 9)  Furosemide 20 Mg Tabs (Furosemide) .... Once Daily 10)  Omega-3 Fish Oil 1200 Mg Caps (Omega-3 Fatty Acids) .... Take 1 Capsule By Mouth Three Times A Day 11)  Tessalon 200 Mg Caps (Benzonatate) .Marland Kitchen.. 1 By Mouth Three Times A Day As Needed Cough  Allergies (verified): No Known Drug Allergies  Past History:  Past Medical History: Last updated: 01/27/2007 OSA- NPSG 03/03/96 AHI 50/hr allergic rhinitis asthma- PFT 01/03/06:  FEV1/FVC .70, small airway slowing with response to bronchodilator exogenous obesity  Past Surgical History: Last updated: 05/24/2008 Right knee surgery  Family History: Last updated: 02/08/2009 asthma - MGF rheumatism - PGM cancer - pat uncle (skin) renal insuf - mother  Social History: Last updated: 02/08/2009 former smoker, quit in 1985 x8yrs, 2-3ppd. alcohol: 2 beers, 8oz scotch daily divorced 2 children, 5 grandchildren retired: Armed forces operational officer with [employer]  Risk Factors: Smoking Status: quit (05/26/2007)  Review of Systems      See HPI  Vital Signs:  Patient profile:   76 year old male Height:      68 inches Weight:      329.38 pounds BMI:     50.26 O2 Sat:      95 % on Room air Temp:     96.9 degrees F oral Pulse rate:   67 / minute BP sitting:   134 / 72  (left arm) Cuff size:   large  Vitals Entered By: Boone Master CNA (March 07, 2009 9:41 AM)  O2 Flow:  Room air CC: 1 month follow up, states no longer has the dark red sputum, and has been doing well with the diovan but BPs still elevated.  prod cough with yellow mucus, wheezing, dyspnea x1week - denies f/c/s Is Patient Diabetic? No Comments Medications reviewed with patient Daytime contact number verified with patient. Boone Master CNA  March 07, 2009 9:41 AM    Physical Exam  Additional Exam:  General: A/Ox3; pleasant and cooperative, NAD, obese SKIN: early stasis rash right pretibial ankles NODES: no lymphadenopathy HEENT:  [place]/AT, EOM- WNL, Conjuctivae- clear, PERRLA, TM-WNL, Nose- clear, Throat- clear and wnl NECK: Supple w/ fair ROM, JVD- none, normal carotid impulses w/o bruits Thyroid-  CHEST: Coarse BS w/ upper airway wheeizng.  HEART: RRR, no m/g/r heard ABDOMEN: QQV:ZDGL, nl pulses, no edema  NEURO: Grossly intact to observation      Impression & Recommendations:  Problem # 1:  ASTHMA (ICD-493.90) Flare w/ upper airway cough syndrome will try off advair  -possible upper airway inflammation.  prevent for possible reflux REC:  Continue on  Diovan 320mg  once daily  Delsym 2 tsp twice daily for cough Tessalon three times a day as needed cough Continue  Omeprazole 20mg  once daily before meal.  Hold fish oil for now.  Conintue on  Zyrtec 10mg  at bedtime    Stop Advair .  Begin Symbicort 160/4.58mcg 2 puffs two times a  day, brush, rinse and gargle after use (put with toothbrush), drink cup of water.  follow up  2 weeks as scheduled with [name]   Complete Medication List: 1)  Diovan 320 Mg Tabs (Valsartan) .Marland Kitchen.. 1 by mouth once daily 2)  Simvastatin 80 Mg Tabs (Simvastatin) .... Take 1 by mouth once daily 3)  Bayer Low Strength 81 Mg Tbec (Aspirin) .... Take 1 tablet by mouth once a day 4)  Flonase 50 Mcg/act Susp (Fluticasone propionate) .Marland Kitchen.. 1 spray each nostril once daily 5)  Advair Diskus 500-50 Mcg/dose Misc (Fluticasone-salmeterol) .... Inhale 1 puff two times a day rinse mouth well 6)  Bipap [employer] 10/3  .... Wear at bedtime 7)  Proair Hfa 108 (90 Base) Mcg/act Aers (Albuterol sulfate) .... 2 puffs and rinse four times a day as needed 8)  Amlodipine Besylate 10 Mg Tabs (Amlodipine besylate) .... Take 1 by mouth once daily 9)  Furosemide 20 Mg Tabs (Furosemide) .... Once daily 10)  Omega-3 Fish Oil 1200 Mg Caps (Omega-3 fatty acids) .... Take 1 capsule by mouth three times a day 11)  Tessalon 200 Mg Caps (Benzonatate) .Marland Kitchen.. 1 by mouth three times a day as needed cough  Other Orders: Est.  Patient Level IV (46962)  Patient Instructions: 1)  Continue on  Diovan 320mg  once daily  2)  Delsym 2 tsp twice daily for cough 3)  Tessalon three times a day as needed cough 4)  Continue  Omeprazole 20mg  once daily before meal.  5)  Hold fish oil for now.  6)  Conintue on  Zyrtec 10mg  at bedtime    7)  Stop Advair .  8)  Begin Symbicort 160/4.52mcg 2 puffs two times a day, brush, rinse and gargle after use (put with toothbrush), drink cup of water.  9)  follow up  2 weeks as scheduled with [name]    Immunization History:  Pneumovax Immunization History:    Pneumovax:  current (03/07/2009)

## 2010-05-24 ENCOUNTER — Encounter: Payer: Self-pay | Admitting: Internal Medicine

## 2010-05-29 ENCOUNTER — Ambulatory Visit (INDEPENDENT_AMBULATORY_CARE_PROVIDER_SITE_OTHER): Payer: Medicare PPO | Admitting: Internal Medicine

## 2010-05-29 ENCOUNTER — Encounter: Payer: Self-pay | Admitting: Internal Medicine

## 2010-05-29 VITALS — BP 146/84 | HR 63 | Ht 69.0 in | Wt 320.0 lb

## 2010-05-29 DIAGNOSIS — G4730 Sleep apnea, unspecified: Secondary | ICD-10-CM

## 2010-05-29 DIAGNOSIS — J309 Allergic rhinitis, unspecified: Secondary | ICD-10-CM

## 2010-05-29 DIAGNOSIS — J45909 Unspecified asthma, uncomplicated: Secondary | ICD-10-CM

## 2010-05-29 DIAGNOSIS — E669 Obesity, unspecified: Secondary | ICD-10-CM

## 2010-05-29 NOTE — Assessment & Plan Note (Addendum)
Good control on daily Zyrtec- we discussed alternatives.

## 2010-05-29 NOTE — Assessment & Plan Note (Addendum)
Still some labile episodes best described as asthma, but controlled.

## 2010-05-29 NOTE — Assessment & Plan Note (Signed)
This remains a significant aspect of most of his health problems. I continue to encourage weight loss.

## 2010-05-29 NOTE — Patient Instructions (Signed)
Continue present meds.   Walk as much as you can to maintain stamina    [facility] parking

## 2010-05-29 NOTE — Progress Notes (Signed)
  Subjective:    Patient ID: [name] [name], male    DOB: 1934/03/28, 76 y.o.   MRN: [id number]  HPI 05/29/10-75 yo M former smoker followed for asthma, sleep apnea, complicated by obesity.  Last here November 28, 2009 with no problems over the winter. 3 weeks ago with sustained time working out in yard he developed by bad sustained cough with wheeze and some phlegm. Current meds gradually controlled. Refers to going to gym, but says with any sustained walking- longer parking lot etc, he has to stop repeatedly to catch his breath. Asks [facility] parking. Today he admits only very occasional cough with trace yellow mucus. Takes zyrtec with good control. He continues compliant with BiPAP, new machine, still on 10/3. He can sleep much better with this one. Still occasional nap in chair.   Review of Systems See HPI Constitutional:   No weight loss, night sweats,  Fevers, chills, fatigue, lassitude. HEENT:   No headaches,  Difficulty swallowing,  Tooth/dental problems,  Sore throat,                No sneezing, itching, ear ache, nasal congestion, post nasal drip,   CV:  No chest pain,  Orthopnea, PND, swelling in lower extremities, anasarca, dizziness, palpitations  GI  No heartburn, indigestion, abdominal pain, nausea, vomiting, diarrhea, change in bowel habits, loss of appetite  Resp: No acute shortness of breath with exertion, but expects this routinely, Not  at rest.  No excess mucus, no productive cough,  No non-productive cough,  No coughing up of blood.  No change in color of mucus.  No wheezing.   Skin: no rash or lesions.  GU: no dysuria, change in color of urine, no urgency or frequency.  No flank pain.  MS:  No joint pain or swelling.  No decreased range of motion.  No back pain.  Psych:  No change in mood or affect. No depression or anxiety.  No memory loss.      Objective:   Physical Exam General- Alert, Oriented, Affect-appropriate, Distress- none acute   Quite obese  Skin-  rash-none, lesions- none, excoriation- none  Lymphadenopathy- none  Head- atraumatic  Eyes- Gross vision intact, PERRLA, conjunctivae clear secretions  Ears- Normal- Hearing, canals, Tm   Nose- Clear, No-  Septal dev, mucus, polyps, erosion, perforation   Throat- Mallampati IV  , mucosa clear , drainage- none, tonsils- atrophic  Neck- flexible , trachea midline, no stridor , thyroid nl, carotid no bruit  Chest - symmetrical excursion , unlabored     Heart/CV- RRR , no murmur , no gallop  , no rub, nl s1 s2                     - JVD- none , edema-trace, stasis changes- none, varices- none     Lung- clear to P&A, wheeze- none, cough- none , dullness-none, rub- none     Chest wall-  Abd- tender-no, distended-no, bowel sounds-present, HSM- no  Br/ Gen/ Rectal- Not done, not indicated  Extrem- cyanosis- none, clubbing, none, atrophy- none, strength- nl  Neuro- grossly intact to observation         Assessment & Plan:

## 2010-06-04 ENCOUNTER — Encounter: Payer: Self-pay | Admitting: Internal Medicine

## 2010-06-04 NOTE — Assessment & Plan Note (Signed)
Good compliance and control now. Weight loss would make a major difference.

## 2010-06-06 NOTE — Assessment & Plan Note (Signed)
[facility]                             PULMONARY OFFICE NOTE   NAME:[name], [name] RABEL                     MRN:          [id number]  DATE:11/19/2006                            DOB:          [birth year]    PROBLEMS:  1. Obstructive sleep apnea.  2. Allergic rhinitis.  3. Asthma.  4. Exogenous obesity.   HISTORY:  He is using his Advair 250/50.  Some wheeze and he feels he is  getting a little worse and asks for a prednisone taper to have on hand.  We discussed this.  He says he is doing well with BiPAP and cannot sleep  without it.  The pressure is still set at inspiratory 10, expiratory 3.   MEDICATIONS:  1. Felodipine 10 mg.  2. Avapro 300 mg.  3. Vytorin 10/20.  4. Advair 250/50.  5. Fluticasone nasal spray.  6. BiPAP.  7. Albuterol rescue inhaler.   ALLERGIES:  No medication allergy.   PHYSICAL EXAMINATION:  VITAL SIGNS:  Weight 322 pounds.  BP 138/70,  pulse 91, room air saturation 93%.  GENERAL:  He is obese.  LUNGS:  There is mild bilateral expiratory wheeze, unlabored.  CARDIOVASCULAR:  Pulse is regular without murmur.  NECK:  No neck vein distention or edema.  No adenopathy.  HEENT:  Nasopharynx clear.   IMPRESSION:  1. Exacerbation of asthma.  2. Obstructive sleep apnea, well controlled on BiPAP.   PLAN:  Prednisone taper to hold.  Continue BiPAP.  Try changing Advair  250/50 to 100/50 because of cough concern once the acute illness  resolves.  Prescription given.  Schedule return in four months, earlier  p.r.n.     Clinton D. Maple Hudson, MD, Tonny Bollman, FACP  Electronically Signed    CDY/MedQ  DD: 11/24/2006  DT: 11/25/2006  Job #: [id number]

## 2010-06-09 NOTE — Assessment & Plan Note (Signed)
[facility]                               PULMONARY OFFICE NOTE   NAME:[name], [name]                     MRN:          [id number]  DATE:12/12/2005                            DOB:          May 27, 1934    PULMONARY FOLLOWUP:   PROBLEMS:  1. Obstructive sleep apnea.  2. Allergic rhinitis.  3. Asthma.  4. Exogenous obesity.   HISTORY:  He has had his third course of prednisone this year after calling  for another on October 15th.  Each time, he gets pretty well cleared up.  He  blames the fall leaves, weather changes, and similar external events for  exacerbations.  He had a physical exam Monday, and some wheezing was heard  then.  He says he often forgets his morning Advair dose.  Rare heartburn.  Nothing purulent.  No chest pain or palpitations.  He got a flu vaccine and  a booster pneumococcal vaccine on October 7th.  He continues BiPAP,  inspiratory 10/expiratory 3 every night.   MEDICATIONS:  1. Felodipine 10 mg.  2. Avapro 300 mg.  3. Vytorin 10/20.  4. Advair 250/50.  5. Fluticasone 50 mcg nasal spray.  6. Albuterol rescue inhaler.   No medication allergies.   OBJECTIVE:  VITAL SIGNS:  He is overweight at 315 pounds.  BP 122/68, pulse  regular at 94.  Room air saturation is 94% at rest.  There is very mild  expiratory wheeze bilaterally in the upper lung zones with no stridor.  HEENT:  His pharynx is clear.  Nasal airway is clear.  He generates a bit of  a wheezy-sounding cough with laughter.  CARDIAC:  Heart sounds are regular without murmur.  There is no neck vein  distention or edema.   I do not have recent PFTs on him, but scores have been mildly reduced on a  study from 2004.   IMPRESSION:  1. Chronic asthma with steroid-dependent exacerbations.  2. Obstructive sleep apnea, controlled, but with concerns as he gains      weight.   PLAN:  1. We are scheduling a PFT.  2. Schedule return in three months.  3. He is  instructed to be diligent about using his Advair b.i.d., noting      Singulair had not helped.  We may have to increase his inhaled steroid      dose.     Clinton D. Maple Hudson, MD, Tonny Bollman, FACP  Electronically Signed    CDY/MedQ  DD: 12/12/2005  DT: 12/13/2005  Job #: [id number]   cc:   Hal T. Stoneking, M.D.

## 2010-06-09 NOTE — Assessment & Plan Note (Signed)
[facility]                               PULMONARY OFFICE NOTE   NAME:[name] [name], [name] [name]                     MRN:          [id number]  DATE:08/10/2005                            DOB:          December 10, 1934    PULMONARY SLEEP FOLLOW-UP:   PROBLEMS:  1.  Obstructive sleep apnea.  2.  Allergic rhinitis.  3.  Asthma.  4.  Exogenous obesity.   HISTORY:  He continues using BiPAP, inspiratory 10, expiratory 3, every  night and says he would not sleep without it.  He occasionally gets mild  nasal congestion, which he treats p.r.n. with a left-over bottle of Nasonex,  which is at least a couple of years old.  I discussed appropriate use of  steroid nasal sprays.  He also uses Advair 250/50 mcg on a p.r.n. basis and  has noticed increased wheezing.  We had given him a prednisone taper and  nebulizer treatment in late April and had refilled his rescue inhaler then.  He tries to avoid using all of his medications.   MEDICATIONS:  1.  Felodipine 10 mg.  2.  Avapro 300 mg.  3.  Vytorin 10/20 mg.  4.  Advair 250/50 mcg.  5.  Aspirin 81 mg.  6.  Nasonex.  7.  Mineral and vitamin supplements.   No medication allergies.   OBJECTIVE:  VITAL SIGNS:  Weight 305 pounds, BP 160/98, pulse regular at 80,  room air saturation 95%.  GENERAL:  No evident distress.  Inspiratory and expiratory wheeze is  unlabored.  HEENT:  Nasal airway is not congested.  Pharynx is not red or irritated.  There is no stridor.  Voice quality is normal.  CARDIAC:  Heart sounds are regular without murmur or gallop.  EXTREMITIES:  No peripheral edema or clubbing.   IMPRESSION:  1.  Inadequately controlled asthma.  2.  Obstructive sleep apnea controlled with BiPAP 10/3.  3.  Exogenous obesity.   PLAN:  1.  Prednisone 8-day taper from 40 mg given to hold at this point with      discussion.  I again reviewed steroid side effects and we discussed in      detail treatment guidelines for  appropriate control of asthma.  2.  Sample prescription of Veramyst for rhinitis, once each nostril for      interval therapy when needed.  3.  Sample prescription refill Advair 250/50 mcg.  4.  Appropriate use of albuterol rescue inhaler two puffs q.i.d. p.r.n.  5.  Nebulizer treatment today, Xopenex 1.25 mg.  6.  Schedule return 3 months but earlier p.r.n. as discussed.                                   Clinton D. Maple Hudson, MD, FCCP, FACP   CDY/MedQ  DD:  08/11/2005  DT:  08/11/2005  Job #:  [id number]   cc:   Hal T. [name], MD

## 2010-06-09 NOTE — Assessment & Plan Note (Signed)
[facility]                             PULMONARY OFFICE NOTE   NAME:[name], [name] BAUTCH                     MRN:          [id number]  DATE:03/14/2006                            DOB:          [birth year]    PROBLEM:  1. Obstructive sleep apnea.  2. Allergic rhinitis.  3. Asthma.  4. Exogenous obesity.   HISTORY:  He continues every night with BiPAP inspiratory 10, expiratory  3 and is comfortable with this. He took a prednisone taper at the  beginning of February for an exacerbation then, but asthma now is under  pretty good control. He still is using Advair just once daily with a  little nasal congestion or post-nasal drip. He has used a prednisone  taper about three times in the last year, and we had some preliminary  discussion of Xolair. We discussed again steroid side effects.   MEDICATIONS:  1. Felodipine ER 10 mg.  2. Avapro 300 mg.  3. Vytorin 10/20.  4. Advair 250/50.  5. Fluticasone nasal spray.  6. Albuterol rescue inhaler.   No medication allergy.   He had had positive allergy testing in 2004 but chose not to have  allergy vaccine trial.   OBJECTIVE:  Weight 322 pounds, BP 158/62, pulse rate 92, room air  saturation 95%.  There is very minimal end-expiratory wheeze bilaterally, unlabored, no  dullness.  HEART:  Sounds are regular without murmur or gallop.  There is no neck vein distention, peripheral edema, adenopathy or rash.  We note that his weight has been drifting up steadily over the last  year.   IMPRESSION:  1. Asthma with exacerbation.  2. Allergic rhinitis.  3. Obstructive sleep apnea adequately controlled on BiPAP.  4. Exogenous obesity.   PLAN:  Weight loss including walk for endurance and weight loss. Sample  trial of Astelin once each nostril b.i.d. p.r.n. with discussion. Resume  b.i.d. use of Advair 250/50, but he is given a  sample of Advair 500/50 to use for 2 weeks. When that runs out, he will  drop  back to his 250/50. Scheduled to return in 3 to 4 months, earlier  p.r.n.     Clinton D. Maple Hudson, MD, Tonny Bollman, FACP  Electronically Signed    CDY/MedQ  DD: 03/14/2006  DT: 03/14/2006  Job #: [id number]   cc:   Hal T. Stoneking, M.D.

## 2010-06-09 NOTE — Op Note (Signed)
NAMENEVAAN, BUNTON                        ACCOUNT NO.:  [id number]   MEDICAL RECORD NO.:  [id number]                   PATIENT TYPE:  AMB   LOCATION:  ENDO                                 FACILITY:  [facility]   PHYSICIAN:  Danise Edge, M.D.                DATE OF BIRTH:  [birth year]   DATE OF PROCEDURE:  01/21/2003  DATE OF DISCHARGE:                                 OPERATIVE REPORT   PROCEDURE:  Colonoscopy and polypectomy.   PROCEDURE INDICATION:  Mr. Claudius Mich is a 76 year old male born  [birth year].  Mr. Ovidio Kin is scheduled to undergo his first screening  colonoscopy with polypectomy to prevent colon cancer.   ENDOSCOPIST:  Danise Edge, M.D.   PREMEDICATION:  Demerol 75 mg, Versed 7.5 mg.   PROCEDURE:  After obtaining informed consent, Mr. Esbenshade was placed in the  left lateral decubitus position.  I administered intravenous Demerol and  intravenous Versed to achieve conscious sedation for the procedure.  The  patient's blood pressure, oxygen saturation and cardiac rhythm were  monitored throughout the procedure and documented in the medical record.   Anal inspection was normal.  Digital rectal exam was normal.  The Olympus  adjustable pediatric colonoscope was introduced into the rectum and advanced  to the cecum.  Colonic preparation for the exam today was satisfactory.   Rectum:  Normal.  Sigmoid colon and descending colon:  Left colonic  diverticulosis without diverticulitis or diverticular stricture formation.  Splenic flexure:  Normal.  Transverse colon:  Normal.  Hepatic flexure:  Normal.  Ascending colon:  Normal.  Cecum and ileocecal valve:  From the  distal cecum, a 5-mm sessile polyp was removed with the electrocautery snare  and submitted for pathologic interpretation.   ASSESSMENT:  1. A 5-mm polyp was removed from the distal cecum.  2. Left colonic diverticulosis.   RECOMMENDATIONS:  If cecal polyp returns neoplastic pathologically, Mr.  Gatley should undergo a repeat colonoscopy in five years.                                               Danise Edge, M.D.    MJ/MEDQ  D:  01/21/2003  T:  01/21/2003  Job:  [id number]

## 2010-06-09 NOTE — Op Note (Signed)
[place]  Patient:    [name] [name], [name] [name]                     MRN: [id number] Proc. Date: 01/17/00 Adm. Date:  [id number] Attending:  Teena Dunk                           Operative Report  PREOPERATIVE DIAGNOSIS:  Complete avulsion and rupture of the quadriceps tendon of the right knee.  POSTOPERATIVE DIAGNOSIS:  Complete avulsion and rupture of the quadriceps tendon of the right knee.  OPERATION:  Primary repair of quadriceps tendon, with a quadplasty.  SURGEON:  Sharlot Gowda., M.D.  ANESTHESIA:  General.  TOURNIQUET TIME:  One hour and five minutes.  DESCRIPTION OF PROCEDURE:  The patient was approached through a longitudinal incision centered over the knee.  He had a severe tear of the quadriceps mechanism, that is the superior attachment of the tendon to the patella. There was a significant degeneration pre-existing, but certainly there was all the evidence of an acute tear, with splitting of the rectus femoris, as well as the vastus medialis and the vastus lateralis.  The tendon edges had to be freshened, as did the superior surface of the patella.  There was no damage to the articular surfaces of the knee that could be seen.  The joint was irrigated.  Three drill holes were placed.  Placed two #2 fiber wire sutures through drill holes, and tied them over a bone ridge.  This repaired the main rectus tendon nicely.  The VMO was repaired with #2 Ethibond suture, as well as the vastus lateralis, with the addition of heavy Vicryl on the more muscular layers.  The superficial retinaculum was torn as well, and was repaired with #0 Vicryl.  The VMO and the vastus lateralis had to be advanced somewhat with the quadplasty-type repair in order to obtain good tissue tension; however, the knee could be bent to 50-60 degrees without any undue tension on the repair.  The remainder of the repair was accomplished with  #2-0 Vicryl in the subcutaneous tissues, and clips on the skin.  Marcaine with epinephrine 10 cc for the skin and 10 cc into the joint.  Bulky dressings, sterile dressings with a Jones-type dressing, as well as a knee immobilizer applied.  The patient was taken to the recovery room in stable condition.DD:  01/17/00 TD:  01/17/00 Job: [id number]

## 2010-06-20 ENCOUNTER — Other Ambulatory Visit: Payer: Self-pay | Admitting: Internal Medicine

## 2010-07-05 ENCOUNTER — Telehealth: Payer: Self-pay | Admitting: Internal Medicine

## 2010-07-05 MED ORDER — FLUTICASONE PROPIONATE 50 MCG/ACT NA SUSP: 1.0000 | Freq: Every day | NASAL | Status: DC

## 2010-07-05 NOTE — Telephone Encounter (Signed)
rx sent to pharmacy

## 2010-07-11 ENCOUNTER — Telehealth: Payer: Self-pay | Admitting: Internal Medicine

## 2010-07-11 MED ORDER — PREDNISONE 10 MG PO TABS: ORAL_TABLET | ORAL | Status: DC

## 2010-07-11 NOTE — Telephone Encounter (Signed)
Spoke with pt and notified of recs per CDY. Pt verbalized understanding and rx was sent to [facility] [address] per his request.

## 2010-07-11 NOTE — Telephone Encounter (Signed)
Spoke with pt.  He is c/o wheezing, increased SOB and cough x 3 wks- cough is prod with clear to yellow sputum. No fever, but has had sweats for the past several nights. No other c/o's. Would like rx called in. Please advise, thanks! No Known Allergies

## 2010-07-11 NOTE — Telephone Encounter (Signed)
Per CY-okay to give Prednisone 10 mg #20 take 4 x 2 days, 3 x 2 days, 2 x 2 days, 1 x 2 days then stop no refills.  

## 2010-10-30 ENCOUNTER — Telehealth: Payer: Self-pay | Admitting: Internal Medicine

## 2010-10-30 NOTE — Telephone Encounter (Signed)
I spoke with pt and he states he is the donut hole and would like a sample of symbicort 160. I advised pt will leave 1 sample upfront for him to p/u. Nothing further was needed

## 2010-11-27 ENCOUNTER — Ambulatory Visit: Payer: Medicare PPO | Admitting: Internal Medicine

## 2010-12-05 ENCOUNTER — Encounter: Payer: Self-pay | Admitting: Internal Medicine

## 2010-12-05 ENCOUNTER — Ambulatory Visit (INDEPENDENT_AMBULATORY_CARE_PROVIDER_SITE_OTHER): Payer: Medicare PPO | Admitting: Internal Medicine

## 2010-12-05 ENCOUNTER — Telehealth: Payer: Self-pay | Admitting: Internal Medicine

## 2010-12-05 VITALS — BP 150/72 | HR 53 | Ht 69.0 in | Wt 317.0 lb

## 2010-12-05 DIAGNOSIS — J45909 Unspecified asthma, uncomplicated: Secondary | ICD-10-CM

## 2010-12-05 DIAGNOSIS — G4730 Sleep apnea, unspecified: Secondary | ICD-10-CM

## 2010-12-05 DIAGNOSIS — Z23 Encounter for immunization: Secondary | ICD-10-CM

## 2010-12-05 MED ORDER — ALBUTEROL SULFATE (2.5 MG/3ML) 0.083% IN NEBU
2.5000 mg | INHALATION_SOLUTION | Freq: Once | RESPIRATORY_TRACT | Status: AC
  Administered 2010-12-05: 2.5 mg via RESPIRATORY_TRACT

## 2010-12-05 MED ORDER — COMPRESSOR/NEBULIZER MISC: 1.0000 | Freq: Four times a day (QID) | Status: AC | PRN

## 2010-12-05 MED ORDER — BUDESONIDE 0.25 MG/2ML IN SUSP: 0.2500 mg | Freq: Two times a day (BID) | RESPIRATORY_TRACT | Status: DC

## 2010-12-05 MED ORDER — METHYLPREDNISOLONE ACETATE 80 MG/ML IJ SUSP
80.0000 mg | Freq: Once | INTRAMUSCULAR | Status: AC
  Administered 2010-12-05: 80 mg via INTRAMUSCULAR

## 2010-12-05 MED ORDER — ALBUTEROL SULFATE (2.5 MG/3ML) 0.083% IN NEBU: 2.5000 mg | INHALATION_SOLUTION | Freq: Four times a day (QID) | RESPIRATORY_TRACT | Status: DC | PRN

## 2010-12-05 NOTE — Progress Notes (Signed)
Patient ID: [name] [name], male    DOB: [birth year], 76 y.o.   MRN: [id number]  HPI 05/29/10-75 yo M former smoker followed for asthma, sleep apnea, complicated by obesity.  Last here November 28, 2009 with no problems over the winter. 3 weeks ago with sustained time working out in yard he developed by bad sustained cough with wheeze and some phlegm. Current meds gradually controlled. Refers to going to gym, but says with any sustained walking- longer parking lot etc, he has to stop repeatedly to catch his breath. Asks [facility] parking. Today he admits only very occasional cough with trace yellow mucus. Takes zyrtec with good control. He continues compliant with BiPAP, new machine, still on 10/3. He can sleep much better with this one. Still occasional nap in chair.   12/05/10- 54 yo M former smoker followed for asthma, sleep apnea, complicated by obesity.  He continues to feel raspiness in his upper airways. His primary physician gave Singulair which he has taken for 3 months, without evident effect. Using his rescue inhaler about one time per week, off and just before he goes to exercise at the [facility]. He continues Symbicort, 2 puffs once daily. Cost is a problem. Triggers for him include temperature change and brisk walking but he denies any awareness of reflux. He continues using his BiPAP machine 10/3/[facility], all night every night with good control.   Review of Systems See HPI Constitutional:   No-   weight loss, night sweats, fevers, chills, fatigue, lassitude. HEENT:   No-  headaches, difficulty swallowing, tooth/dental problems, sore throat,       No-  sneezing, itching, ear ache, nasal congestion, post nasal drip,  CV:  No-   chest pain, orthopnea, PND, swelling in lower extremities, anasarca,                                  dizziness, palpitations Resp: No- acute  shortness of breath with exertion or at rest.              No-   productive cough,  chronic-productive cough,  No- coughing up  of blood.              No-   change in color of mucus.  No- wheezing.   Skin: No-   rash or lesions. GI:  No-   heartburn, indigestion, abdominal pain, nausea, vomiting, diarrhea,                 change in bowel habits, loss of appetite GU: No-   dysuria, change in color of urine, no urgency or frequency.  No- flank pain. MS:  No-   joint pain or swelling.  No- decreased range of motion.  No- back pain. Neuro-     nothing unusual Psych:  No- change in mood or affect. No depression or anxiety.  No memory loss.      Objective:   Physical Exam General- Alert, Oriented, Affect-appropriate, Distress- none acute; markedly obese  Skin- rash-none, lesions- none, excoriation- none Lymphadenopathy- none Head- atraumatic            Eyes- Gross vision intact, PERRLA, conjunctivae clear secretions            Ears- Hearing, canals-normal            Nose- Clear, no-Septal dev, mucus, polyps, erosion, perforation  Throat- Mallampati III , mucosa clear , drainage- none, tonsils- atrophic Neck- flexible , trachea midline, no stridor , thyroid nl, carotid no bruit Chest - symmetrical excursion , unlabored           Heart/CV- RRR , no murmur , no gallop  , no rub, nl s1 s2                           - JVD- none , edema- none, stasis changes- none, varices- none           Lung- clear to P&A,  but raspy cough with some inspiratory and expiratory wheeze , dullness-none, rub- none           Chest wall-  Abd- tender-no, distended-no, bowel sounds-present, HSM- no Br/ Gen/ Rectal- Not done, not indicated Extrem- cyanosis- none, clubbing, none, atrophy- none, strength- nl Neuro- grossly intact to observation

## 2010-12-05 NOTE — Telephone Encounter (Signed)
PATIENT SAYS HE "JUST TALKED TO LESLIE AND WANTED TO ASK HER SOMETHING ELSE RE: QTY / EXPENSE OF MEDS. [name] [name]

## 2010-12-05 NOTE — Patient Instructions (Addendum)
Ordered- scripts printed- nebulizer with albuterol and budesonide for [facility]. He called back- budesonide will be too expensive, so that is being held for now.  Try using Singulair on a week, off a week, until you see if you can tell a benefit.   Flu vax  Neb alb  Depo 80

## 2010-12-05 NOTE — Telephone Encounter (Signed)
Spoke with pt and notified of recs per CDY He verbalized understanding and states nothing further needed  

## 2010-12-05 NOTE — Telephone Encounter (Signed)
Per CY-try the nebulizer without budesonide and see how he does.

## 2010-12-05 NOTE — Telephone Encounter (Signed)
PT CALLED BACK- ASKS TO SPEAK TO NURSE RE: SAME- HAS A QUESTION RE: QTY FOR [employer] [facility]. Hazel Sams

## 2010-12-05 NOTE — Telephone Encounter (Signed)
Spoke with pt. He states that he received a call from pharm that the albuterol nebs would be less than 4 $ but budesonide will be over 80$ per one month supply. He wants to know if there is something else that can be called in in its place or not. He asked about samples of budesonide and I advised that we do not carry these. Please advise, thanks!

## 2010-12-05 NOTE — Telephone Encounter (Signed)
Spoke with [name] [name] again and he states that he did not have anything further to address. Will forward this msg back to Dr Maple Hudson to address.

## 2010-12-09 NOTE — Assessment & Plan Note (Signed)
Good compliance and control. Weight loss would help.  

## 2010-12-09 NOTE — Assessment & Plan Note (Signed)
Chronic asthmatic bronchitis with inadequate control. Plan-will try to set him up with a home nebulizer machine, albuterol and maybe later, if not too expensive, budesonide. He will try Singulair off and on, one week at a time, to see if he can really tell any Cost effectiveness. Flu vaccine.

## 2010-12-29 ENCOUNTER — Encounter: Payer: Self-pay | Admitting: Internal Medicine

## 2011-01-31 ENCOUNTER — Telehealth: Payer: Self-pay | Admitting: Internal Medicine

## 2011-01-31 NOTE — Telephone Encounter (Signed)
ATC x 1 to get clarification on what is needed. Looks like Pa needs to be done on nebulizer medications. Will need to contact pt in the morning to see what he needs.

## 2011-02-02 NOTE — Telephone Encounter (Signed)
I called # listed and was advised they are sending Over PA form to fill out and it needs to be faxed back. I gave triage fax # and will await fax.

## 2011-02-05 NOTE — Telephone Encounter (Signed)
Forms have been received and placed on CDY cart for signature. Once done will fax back. Please advise [name], thanks

## 2011-02-08 NOTE — Telephone Encounter (Signed)
I have faxed the forms back to insurance company. Will await for decision from [employer].

## 2011-02-13 ENCOUNTER — Telehealth: Payer: Self-pay | Admitting: Internal Medicine

## 2011-02-13 MED ORDER — ALBUTEROL SULFATE (2.5 MG/3ML) 0.083% IN NEBU: 2.5000 mg | INHALATION_SOLUTION | Freq: Four times a day (QID) | RESPIRATORY_TRACT | Status: DC | PRN

## 2011-02-13 MED ORDER — BUDESONIDE 0.25 MG/2ML IN SUSP: 0.2500 mg | Freq: Two times a day (BID) | RESPIRATORY_TRACT | Status: DC

## 2011-02-13 NOTE — Telephone Encounter (Signed)
I spoke with pt and he states he would like rx filled through [facility] on [address]. Will print off rx for cdy to sign w/ dx on it since [facility] requires this now. Rx's have been printed and placed on cdy cart for signature

## 2011-02-13 NOTE — Telephone Encounter (Signed)
Katie have you seen a response for pt's albuterol and budesonide from [employer].

## 2011-02-13 NOTE — Telephone Encounter (Signed)
I have faxed the signed Rx's to [facility] on [address].

## 2011-02-13 NOTE — Telephone Encounter (Signed)
Please see phone note from 02-12-10; [employer] called stating that Rx's are approved under his Part B plan-Mindy printed the Rx's and we have sent them to [facility] as requested.

## 2011-02-14 ENCOUNTER — Telehealth: Payer: Self-pay | Admitting: Internal Medicine

## 2011-02-14 NOTE — Telephone Encounter (Signed)
rx already had the dx code on it when faxed.  Called [facility] spoke with pharm tech who verified that they received the rx's with the verified dx code on them.  She then told me that these need PA.  Pt has medicare.  Asked if the nebs have been filed under medicare part D or B.  Pharm tech asked me to hold while she checked.  When she returned she informed me "not to worry about it, the pharmacist figured it out."  Will sign off.

## 2011-02-14 NOTE — Telephone Encounter (Signed)
Error.  May call back if necessary.  [name] [name]

## 2011-04-24 ENCOUNTER — Encounter: Payer: Self-pay | Admitting: Internal Medicine

## 2011-04-24 ENCOUNTER — Ambulatory Visit (INDEPENDENT_AMBULATORY_CARE_PROVIDER_SITE_OTHER): Payer: Medicare Other | Admitting: Internal Medicine

## 2011-04-24 VITALS — BP 152/76 | HR 52 | Ht 69.0 in | Wt 326.6 lb

## 2011-04-24 DIAGNOSIS — J45909 Unspecified asthma, uncomplicated: Secondary | ICD-10-CM

## 2011-04-24 DIAGNOSIS — G4730 Sleep apnea, unspecified: Secondary | ICD-10-CM

## 2011-04-24 DIAGNOSIS — E669 Obesity, unspecified: Secondary | ICD-10-CM

## 2011-04-24 MED ORDER — BUDESONIDE-FORMOTEROL FUMARATE 160-4.5 MCG/ACT IN AERO: 2.0000 | INHALATION_SPRAY | Freq: Two times a day (BID) | RESPIRATORY_TRACT | Status: DC

## 2011-04-24 MED ORDER — METHYLPREDNISOLONE ACETATE 80 MG/ML IJ SUSP
80.0000 mg | Freq: Once | INTRAMUSCULAR | Status: AC
  Administered 2011-04-24: 80 mg via INTRAMUSCULAR

## 2011-04-24 MED ORDER — BUDESONIDE 0.25 MG/2ML IN SUSP: 0.2500 mg | Freq: Two times a day (BID) | RESPIRATORY_TRACT | Status: DC

## 2011-04-24 NOTE — Patient Instructions (Addendum)
Neb brovana  Depo 80  Script sent Symbicort 160   2 puffs then rinse, twice every day  Ok to use your nebulizer machine with albuterol and budesonide, up to 4 times daily, if necessary  Ok to take lasix 40 mg daily x 3 days, then drop back to 20 mg daily  Continue BiPAP

## 2011-04-24 NOTE — Progress Notes (Signed)
Patient ID: [name] [name], male    DOB: 1934-12-25, 77 y.o.   MRN: [id number]  HPI 05/29/10-75 yo M former smoker followed for asthma, sleep apnea, complicated by obesity.  Last here November 28, 2009 with no problems over the winter. 3 weeks ago with sustained time working out in yard he developed by bad sustained cough with wheeze and some phlegm. Current meds gradually controlled. Refers to going to gym, but says with any sustained walking- longer parking lot etc, he has to stop repeatedly to catch his breath. Asks [facility] parking. Today he admits only very occasional cough with trace yellow mucus. Takes zyrtec with good control. He continues compliant with BiPAP, new machine, still on 10/3. He can sleep much better with this one. Still occasional nap in chair.   12/05/10- 4 yo M former smoker followed for asthma, sleep apnea, complicated by obesity.  He continues to feel raspiness in his upper airways. His primary physician gave Singulair which he has taken for 3 months, without evident effect. Using his rescue inhaler about one time per week, off and just before he goes to exercise at the [facility]. He continues Symbicort, 2 puffs once daily. Cost is a problem. Triggers for him include temperature change and brisk walking but he denies any awareness of reflux. He continues using his BiPAP machine 10/3/[facility], all night every night with good control.  04/24/11-  61 yo M former smoker followed for asthma, sleep apnea, complicated by obesity.  Increased shortness of breath/asthma over the past month with gradual progression. Best peak flow was 440 but today she only got 230. After medication she reached 320. Raspy cough and tussive back pain but little sputum. Denies fever. Was using nebulizer albuterol and budesonide. Now her [employer] does not want to cover nebulized budesonide. Singulair was no help.  Review of SystemsSee HPI Constitutional:   No-   weight loss, night sweats,  fevers, chills, fatigue, lassitude. HEENT:   No-  headaches, difficulty swallowing, tooth/dental problems, sore throat,       No-  sneezing, itching, ear ache, nasal congestion, post nasal drip,  CV:  No-   chest pain, orthopnea, PND, swelling in lower extremities, anasarca, dizziness, palpitations Resp: Increasing acute  shortness of breath with exertion or at rest.              No-   productive cough,  chronic- nonproductive cough,  No- coughing up of blood.              No-   change in color of mucus.  No- wheezing.   Skin: No-   rash or lesions. GI:  No-   heartburn, indigestion, abdominal pain, nausea, vomiting, diarrhea,                 change in bowel habits, loss of appetite GU: MS:  . Neuro-     nothing unusual Psych:  No- change in mood or affect. No depression or anxiety.  No memory loss.      Objective:   Physical Exam General- Alert, Oriented, Affect-appropriate, Distress- none acute; markedly obese  Skin- rash-none, lesions- none, excoriation- none Lymphadenopathy- none Head- atraumatic            Eyes- Gross vision intact, PERRLA, conjunctivae clear secretions            Ears- Hearing, canals-normal            Nose- Clear, no-Septal dev, mucus, polyps, erosion,  perforation             Throat- Mallampati III , mucosa clear , drainage- none, tonsils- atrophic, hoarse Neck- flexible , trachea midline, no stridor , thyroid nl, carotid no bruit Chest - symmetrical excursion , unlabored           Heart/CV- RRR , no murmur , no gallop  , no rub, nl s1 s2                           - JVD- none ,  + 1 to 2+edema, stasis changes- none, varices- none           Lung- clear to P&A,  but raspy cough with some inspiratory and expiratory wheeze , dullness-none, rub- none           Chest wall-  Abd-  Br/ Gen/ Rectal- Not done, not indicated Extrem- cyanosis- none, clubbing, none, atrophy- none, strength- nl Neuro- grossly intact to observation

## 2011-05-01 NOTE — Assessment & Plan Note (Signed)
Recent subacute exacerbation of asthma with bronchitis. Plan-increase Lasix to 40 mg daily for 3 days, then dropped back to 20 mg daily. Nebulized Brovana. Depo 80 Try Symbicort 160. Use nebulized budesonide only if needed extra.

## 2011-05-01 NOTE — Assessment & Plan Note (Signed)
Weight loss would help respiratory reserve.

## 2011-05-01 NOTE — Assessment & Plan Note (Signed)
Continues good compliance and control. Weight loss would help.

## 2011-05-30 ENCOUNTER — Other Ambulatory Visit: Payer: Self-pay | Admitting: *Deleted

## 2011-05-30 MED ORDER — BUDESONIDE 0.25 MG/2ML IN SUSP: 0.2500 mg | Freq: Two times a day (BID) | RESPIRATORY_TRACT | Status: DC

## 2011-05-30 NOTE — Telephone Encounter (Signed)
Received fax from [facility] advising pt is requesting 2 boxes of budesonide at a time. i have sent in new rx

## 2011-06-05 ENCOUNTER — Encounter: Payer: Self-pay | Admitting: Internal Medicine

## 2011-06-05 ENCOUNTER — Ambulatory Visit (INDEPENDENT_AMBULATORY_CARE_PROVIDER_SITE_OTHER): Payer: Medicare Other | Admitting: Internal Medicine

## 2011-06-05 VITALS — BP 140/78 | HR 50 | Ht 68.0 in | Wt 309.2 lb

## 2011-06-05 DIAGNOSIS — G4730 Sleep apnea, unspecified: Secondary | ICD-10-CM

## 2011-06-05 DIAGNOSIS — E669 Obesity, unspecified: Secondary | ICD-10-CM

## 2011-06-05 DIAGNOSIS — J45909 Unspecified asthma, uncomplicated: Secondary | ICD-10-CM

## 2011-06-05 MED ORDER — PROMETHAZINE-CODEINE 6.25-10 MG/5ML PO SYRP: 5.0000 mL | ORAL_SOLUTION | ORAL | Status: AC | PRN

## 2011-06-05 MED ORDER — LEVALBUTEROL HCL 0.63 MG/3ML IN NEBU
0.6300 mg | INHALATION_SOLUTION | Freq: Once | RESPIRATORY_TRACT | Status: AC
  Administered 2011-06-05: 0.63 mg via RESPIRATORY_TRACT

## 2011-06-05 NOTE — Patient Instructions (Addendum)
Sample Arcapta 24 hr bronchodilator inhaler  Sample Tudorza    1 puff,  twice daily   anticholinergic bronchodilator  Continue Symbicort 2 puffs and rinse twice daily  Script for cough syrup  Neb xop 0.63

## 2011-06-05 NOTE — Progress Notes (Signed)
Patient ID: [name] [name], male    DOB: May 11, 1934, 77 y.o.   MRN: [id number]  HPI 05/29/10-75 yo M former smoker followed for asthma, sleep apnea, complicated by obesity.  Last here November 28, 2009 with no problems over the winter. 3 weeks ago with sustained time working out in yard he developed by bad sustained cough with wheeze and some phlegm. Current meds gradually controlled. Refers to going to gym, but says with any sustained walking- longer parking lot etc, he has to stop repeatedly to catch his breath. Asks [facility] parking. Today he admits only very occasional cough with trace yellow mucus. Takes zyrtec with good control. He continues compliant with BiPAP, new machine, still on 10/3. He can sleep much better with this one. Still occasional nap in chair.   12/05/10- 54 yo M former smoker followed for asthma, sleep apnea, complicated by obesity.  He continues to feel raspiness in his upper airways. His primary physician gave Singulair which he has taken for 3 months, without evident effect. Using his rescue inhaler about one time per week, off and just before he goes to [facility] the [facility]. He continues Symbicort, 2 puffs once daily. Cost is a problem. Triggers for him include temperature change and brisk walking but he denies any awareness of reflux. He continues using his BiPAP machine 10/3/[employer], all night every night with good control.  04/24/11-  17 yo M former smoker followed for asthma, sleep apnea, complicated by obesity.  Increased shortness of breath/asthma over the past month with gradual progression. Best peak flow was 440 but today she only got 230. After medication she reached 320. Raspy cough and tussive back pain but little sputum. Denies fever. Was using nebulizer albuterol and budesonide. Now [employer] does not want to cover nebulized budesonide. Singulair was no help.  06/05/11- 30 yo M former smoker followed for asthma, sleep apnea, complicated by  obesity.   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 05-02-11 still had swelling in feet and legs, 05-16-11 began" jucing"-asthma and things were getting better; peak flow was around 450. 05-18-11 was able to "belly laugh" without coughing (first time in years) 05-25-11-was helping with yard work,sat down on deck, later that night had fever and chills(felt almost out of it-almost called 911 due to feeling so bad). Resolved with gradual clearing of sweats over next week on his own. Singulair with no help. "Loves" BiPap.  Review of Systems-See HPI Constitutional:   No-   weight loss, night sweats, fevers, chills, fatigue, lassitude. HEENT:   No-  headaches, difficulty swallowing, tooth/dental problems, sore throat,       No-  sneezing, itching, ear ache, nasal congestion, post nasal drip,  CV:  No-   chest pain, orthopnea, PND, swelling in lower extremities, anasarca, dizziness, palpitations Resp: Increasing acute  shortness of breath with exertion or at rest.              No-   productive cough,  chronic- nonproductive cough,  No- coughing up of blood.              No-   change in color of mucus.  No- wheezing.   Skin: No-   rash or lesions. GI:  No-   heartburn, indigestion, abdominal pain, nausea, vomiting, diarrhea,  change in bowel habits, loss of appetite GU: MS:  . Neuro-     nothing unusual Psych:  No- change in mood or affect. No depression or anxiety.  No memory loss.    Objective:   Physical Exam General- Alert, Oriented, Affect-appropriate, Distress- none acute; markedly obese  Skin- rash-none, lesions- none, excoriation- none Lymphadenopathy- none Head- atraumatic            Eyes- Gross vision intact, PERRLA, conjunctivae clear secretions            Ears- Hearing, canals-normal            Nose- Clear, no-Septal dev, mucus, polyps, erosion, perforation             Throat-  Mallampati III-IV , mucosa clear , drainage- none, tonsils- atrophic, hoarse Neck- flexible , trachea midline, no stridor , thyroid nl, carotid no bruit Chest - symmetrical excursion , unlabored           Heart/CV- RRR , no murmur , no gallop  , no rub, nl s1 s2                           - JVD- none ,  + 1 to 2+edema, stasis changes- none, varices- none           Lung- active wheeze,  raspy cough, dullness-none, rub- none. Shows cloudy white mucus.           Chest wall-  Abd-  Br/ Gen/ Rectal- Not done, not indicated Extrem- cyanosis- none, clubbing, none, atrophy- none, strength- nl Neuro- grossly intact to observation

## 2011-06-10 NOTE — Assessment & Plan Note (Signed)
He believes drinking large amounts of fruit and vegetable juice will make things better. We discussed calories and exercise.

## 2011-06-10 NOTE — Assessment & Plan Note (Signed)
Good compliance and control. Weight loss would help significantly.

## 2011-06-10 NOTE — Assessment & Plan Note (Signed)
Nonspecific exacerbation in the last day or 2, but control has not been good over the last month or 2. Plan-Arcapta, cough syrup, Tudorza.

## 2011-07-18 ENCOUNTER — Telehealth: Payer: Self-pay | Admitting: Internal Medicine

## 2011-07-18 MED ORDER — ACLIDINIUM BROMIDE 400 MCG/ACT IN AEPB: 1.0000 | INHALATION_SPRAY | Freq: Two times a day (BID) | RESPIRATORY_TRACT | Status: DC

## 2011-07-18 NOTE — Telephone Encounter (Signed)
Spoke with pt. He states that his breathing has improved very much on [place]. Would like rx sent to pharm. I have sent rx and nothing further needed.

## 2011-08-15 ENCOUNTER — Encounter: Payer: Self-pay | Admitting: Internal Medicine

## 2011-08-15 ENCOUNTER — Ambulatory Visit (INDEPENDENT_AMBULATORY_CARE_PROVIDER_SITE_OTHER): Payer: Medicare Other | Admitting: Internal Medicine

## 2011-08-15 VITALS — BP 158/80 | HR 78 | Ht 68.0 in | Wt 308.4 lb

## 2011-08-15 DIAGNOSIS — J45909 Unspecified asthma, uncomplicated: Secondary | ICD-10-CM

## 2011-08-15 DIAGNOSIS — E669 Obesity, unspecified: Secondary | ICD-10-CM

## 2011-08-15 DIAGNOSIS — G4730 Sleep apnea, unspecified: Secondary | ICD-10-CM

## 2011-08-15 MED ORDER — PREDNISONE 10 MG PO TABS: ORAL_TABLET | ORAL | Status: DC

## 2011-08-15 MED ORDER — TIOTROPIUM BROMIDE MONOHYDRATE 18 MCG IN CAPS: 18.0000 ug | ORAL_CAPSULE | Freq: Every day | RESPIRATORY_TRACT | Status: DC

## 2011-08-15 NOTE — Patient Instructions (Addendum)
Sample and script for Spiriva - 1 daily--- this would replace Tudorza  Samples of albuterol nebules if available  Script sent for prednisone taper

## 2011-08-15 NOTE — Progress Notes (Signed)
Patient ID: [name] [name], male    DOB: [birth year], 77 y.o.   MRN: [id number]  HPI 05/29/10-75 yo M former smoker followed for asthma, sleep apnea, complicated by obesity.  Last here November 28, 2009 with no problems over the winter. 3 weeks ago with sustained time working out in yard he developed by bad sustained cough with wheeze and some phlegm. Current meds gradually controlled. Refers to going to gym, but says with any sustained walking- longer parking lot etc, he has to stop repeatedly to catch his breath. Asks [facility] parking. Today he admits only very occasional cough with trace yellow mucus. Takes zyrtec with good control. He continues compliant with BiPAP, new machine, still on 10/3. He can sleep much better with this one. Still occasional nap in chair.   12/05/10- 73 yo M former smoker followed for asthma, sleep apnea, complicated by obesity.  He continues to feel raspiness in his upper airways. His primary physician gave Singulair which he has taken for 3 months, without evident effect. Using his rescue inhaler about one time per week, off and just before he goes to exercise at the [facility]. He continues Symbicort, 2 puffs once daily. Cost is a problem. Triggers for him include temperature change and brisk walking but he denies any awareness of reflux. He continues using his BiPAP machine 10/3/[employer], all night every night with good control.  04/24/11-  79 yo M former smoker followed for asthma, sleep apnea, complicated by obesity.  Increased shortness of breath/asthma over the past month with gradual progression. Best peak flow was 440 but today she only got 230. After medication she reached 320. Raspy cough and tussive back pain but little sputum. Denies fever. Was using nebulizer albuterol and budesonide. Now [employer] does not want to cover nebulized budesonide. Singulair was no help.  06/05/11- 55 yo M former smoker followed for asthma, sleep apnea, complicated by  obesity.   PCP Dr [name] Wears BiPAP every night for approx 6-8 hours at night; Using nebulizers in morning, using Symbicort at noon and at night. 05-02-11 still had swelling in feet and legs, 05-16-11 began" jucing"-asthma and things were getting better; peak flow was around 450. 05-18-11 was able to "belly laugh" without coughing (first time in years) 05-25-11-was helping with yard work,sat down on deck, later that night had fever and chills(felt almost out of it-almost called 911 due to feeling so bad). Resolved with gradual clearing of sweats over next week on his own. Singulair with no help. "Loves" BiPap.  08/15/11- 49 yo M former smoker followed for asthma, sleep apnea, complicated by obesity.   PCP Dr [name] Pt on Bipap.  uses approx 8 hours 7 days weeks.pt states has some wheezing  cough,. has some gout issues   Resolved acute asthmatic bronchitis at last visit. Since then and long-term, as tended to wheeze, on most days. [name] helped but was too expensive. Medications reviewed. Complains today of gout in right foot-not calf pain.   Review of Systems-See HPI Constitutional:   No-   weight loss, night sweats, fevers, chills, fatigue, lassitude. HEENT:   No-  headaches, difficulty swallowing, tooth/dental problems, sore throat,       No-  sneezing, itching, ear ache, +nasal congestion, post nasal drip,  CV:  No-   chest pain, orthopnea, PND, swelling in lower extremities, anasarca, dizziness, palpitations Resp: +Persistent  shortness of breath with exertion or at rest.  No-   productive cough,  +chronic- nonproductive cough,  No- coughing up of blood.              No-   change in color of mucus.  + wheezing.   Skin: No-   rash or lesions. GI:  No-   heartburn, indigestion, abdominal pain, nausea, vomiting,  GU: MS:  . Pain dorsum right foot Neuro-     nothing unusual Psych:  No- change in mood or affect. No depression or anxiety.  No memory loss.    Objective:    Physical Exam General- Alert, Oriented, Affect-appropriate, Distress- none acute; markedly obese  Skin- rash-none, lesions- none, excoriation- none Lymphadenopathy- none Head- atraumatic            Eyes- Gross vision intact, PERRLA, conjunctivae clear secretions            Ears- Hearing, canals-normal            Nose- Clear, no-Septal dev, mucus, polyps, erosion, perforation             Throat- Mallampati III-IV , mucosa clear , drainage- none, tonsils- atrophic, hoarse Neck- flexible , trachea midline, no stridor , thyroid nl, carotid no bruit Chest - symmetrical excursion , unlabored           Heart/CV- RRR , no murmur , no gallop  , no rub, nl s1 s2                           - JVD- none ,  + 1 to 2+edema, stasis changes- none, varices- none           Lung- +active wheeze, unlabored,  no- cough, dullness-none, rub- none. s.           Chest wall-  Abd-  Br/ Gen/ Rectal- Not done, not indicated Extrem- cyanosis- none, clubbing, none, atrophy- none, strength- nl Neuro- grossly intact to observation

## 2011-08-21 NOTE — Assessment & Plan Note (Signed)
Continued good compliance and control. He uses it all night every night. I encouraged weight loss

## 2011-08-21 NOTE — Assessment & Plan Note (Addendum)
Encouraged to lose weight. Consider referral to nutrition Center

## 2011-08-21 NOTE — Assessment & Plan Note (Signed)
Chronic active and in-adequately controlled by his meds. Plan-nebulizer albuterol, prednisone taper, Spiriva instead of Tudorza.

## 2011-10-09 ENCOUNTER — Telehealth: Payer: Self-pay | Admitting: Internal Medicine

## 2011-10-09 MED ORDER — PREDNISONE 10 MG PO TABS: ORAL_TABLET | ORAL | Status: DC

## 2011-10-09 NOTE — Telephone Encounter (Signed)
Per CY-okay to give Prednisone 10mg #20 take 4 x 2 days, 3 x 2 days, 2 x 2 days, 1 x 2 days, then stop, no refills.  

## 2011-10-09 NOTE — Telephone Encounter (Signed)
Pt c/o worsening cough, wheezing and sob over the past 2 weeks. He says his chest feels tight and sputum is yellow, which is normal for him. He is requesting a prednisone taper to help clear this up. Pt last seen 08/15/11 and is scheduled for follow-up on 11/13/11. Pls advise .No Known Allergies

## 2011-10-09 NOTE — Telephone Encounter (Signed)
Spoke with pt and notified of recs per CDY He verbalized understanding and states nothing further needed  

## 2011-11-13 ENCOUNTER — Ambulatory Visit (INDEPENDENT_AMBULATORY_CARE_PROVIDER_SITE_OTHER): Payer: Medicare Other | Admitting: Internal Medicine

## 2011-11-13 ENCOUNTER — Encounter: Payer: Self-pay | Admitting: Internal Medicine

## 2011-11-13 VITALS — BP 148/82 | HR 75 | Ht 69.0 in | Wt 312.0 lb

## 2011-11-13 DIAGNOSIS — G4730 Sleep apnea, unspecified: Secondary | ICD-10-CM

## 2011-11-13 DIAGNOSIS — J45909 Unspecified asthma, uncomplicated: Secondary | ICD-10-CM

## 2011-11-13 DIAGNOSIS — Z23 Encounter for immunization: Secondary | ICD-10-CM

## 2011-11-13 NOTE — Patient Instructions (Addendum)
Sample Dulera 200  2 puffs twice then rinse mouth, twice every day    Try this instead of Symbicort for now to see if your asthma control gets better.   Ok to try off and on spiriva a week at a time, to decide if it is cost effective.   Flu vax  We can continue BIPAP 10/3  Please call as needed

## 2011-11-13 NOTE — Progress Notes (Signed)
Patient ID: [name] [name], male    DOB: June 25, 1934, 77 y.o.   MRN: [id number]  HPI 05/29/10-75 yo M former smoker followed for asthma, sleep apnea, complicated by obesity.  Last here November 28, 2009 with no problems over the winter. 3 weeks ago with sustained time working out in yard he developed by bad sustained cough with wheeze and some phlegm. Current meds gradually controlled. Refers to going to gym, but says with any sustained walking- longer parking lot etc, he has to stop repeatedly to catch his breath. Asks [facility] parking. Today he admits only very occasional cough with trace yellow mucus. Takes zyrtec with good control. He continues compliant with BiPAP, new machine, still on 10/3. He can sleep much better with this one. Still occasional nap in chair.   12/05/10- 25 yo M former smoker followed for asthma, sleep apnea, complicated by obesity.  He continues to feel raspiness in his upper airways. His primary physician gave Singulair which he has taken for 3 months, without evident effect. Using his rescue inhaler about one time per week, off and just before he goes to exercise at the [facility]. He continues Symbicort, 2 puffs once daily. Cost is a problem. Triggers for him include temperature change and brisk walking but he denies any awareness of reflux. He continues using his BiPAP machine 10/3/[employer], all night every night with good control.  04/24/11-  25 yo M former smoker followed for asthma, sleep apnea, complicated by obesity.  Increased shortness of breath/asthma over the past month with gradual progression. Best peak flow was 440 but today she only got 230. After medication she reached 320. Raspy cough and tussive back pain but little sputum. Denies fever. Was using nebulizer albuterol and budesonide. Now [employer] does not want to cover nebulized budesonide. Singulair was no help.  06/05/11- 26 yo M former smoker followed for asthma, sleep apnea, complicated by  obesity.   PCP Dr [name] Wears BiPAP every night for approx 6-8 hours at night; Using nebulizers in morning, using Symbicort at noon and at night. 05-02-11 still had swelling in feet and legs, 05-16-11 began" jucing"-asthma and things were getting better; peak flow was around 450. 05-18-11 was able to "belly laugh" without coughing (first time in years) 05-25-11-was helping with yard work,sat down on deck, later that night had fever and chills(felt almost out of it-almost called 911 due to feeling so bad). Resolved with gradual clearing of sweats over next week on his own. Singulair with no help. "Loves" BiPap.  08/15/11- 67 yo M former smoker followed for asthma, sleep apnea, complicated by obesity.   PCP Dr [name] Pt on Bipap.  uses approx 8 hours 7 days weeks.pt states has some wheezing  cough,. has some gout issues   Resolved acute asthmatic bronchitis at last visit. Since then and long-term, as tended to wheeze, on most days. [name] helped but was too expensive. Medications reviewed. Complains today of gout in right foot-not calf pain.   11/13/11-77 yo M former smoker followed for asthma, sleep apnea, complicated by obesity.   PCP Dr [name] Wears bipap 10/3 [employer],wears avg. 8 hrs.,fitting good,sob increased x  2-3 wks. ,wheezing,no cp or tightness,sweats We called in prednisone in September for acute exacerbation of asthma that helped. Wheeze and cough are gradually coming back. Cough is productive of white sputum. Using Symbicort irregularly and nebulized budesonide only once daily. We discussed the importance of appropriate use of his maintenance controller medications.  Review  of Systems-See HPI Constitutional:   No-   weight loss, night sweats, fevers, chills, fatigue, lassitude. HEENT:   No-  headaches, difficulty swallowing, tooth/dental problems, sore throat,       No-  sneezing, itching, ear ache, +nasal congestion, post nasal drip,  CV:  No-   chest pain, orthopnea, PND, swelling  in lower extremities, anasarca, dizziness, palpitations Resp: +Persistent  shortness of breath with exertion or at rest.            +  productive cough,  +chronic- nonproductive cough,  No- coughing up of blood.              No-   change in color of mucus.  + wheezing.   Skin: No-   rash or lesions. GI:  No-   heartburn, indigestion, abdominal pain, nausea, vomiting,  GU: MS:  . Pain dorsum right foot Neuro-     nothing unusual Psych:  No- change in mood or affect. No depression or anxiety.  No memory loss.    Objective:   Physical Exam General- Alert, Oriented, Affect-appropriate, Distress- none acute; markedly obese  Skin- rash-none, lesions- none, excoriation- none Lymphadenopathy- none Head- atraumatic            Eyes- Gross vision intact, PERRLA, conjunctivae clear secretions            Ears- Hearing, canals-normal            Nose- Clear, no-Septal dev, mucus, polyps, erosion, perforation             Throat- Mallampati III-IV , mucosa clear , drainage- none, tonsils- atrophic, hoarse Neck- flexible , trachea midline, no stridor , thyroid nl, carotid no bruit Chest - symmetrical excursion , unlabored           Heart/CV- RRR , no murmur , no gallop  , no rub, nl s1 s2                           - JVD- none ,  + 1 to 2+edema, stasis changes- none, varices- none           Lung- + diffuse bilateral wheeze, unlabored,  no- cough,  dullness-none, rub- none. s.           Chest wall-  Abd-  Br/ Gen/ Rectal- Not done, not indicated Extrem- cyanosis- none, clubbing, none, atrophy- none, strength- nl Neuro- grossly intact to observation

## 2011-11-14 ENCOUNTER — Encounter: Payer: Self-pay | Admitting: Internal Medicine

## 2011-11-14 ENCOUNTER — Encounter: Payer: Self-pay | Admitting: *Deleted

## 2011-11-24 ENCOUNTER — Encounter: Payer: Self-pay | Admitting: Internal Medicine

## 2011-11-24 NOTE — Assessment & Plan Note (Signed)
Good compliance and control. Weight loss would make a big difference.

## 2011-11-24 NOTE — Assessment & Plan Note (Signed)
I think a major problem is lack of consistent use of his maintenance controller medications we discussed this today. Watch for cost as a problem. Plan-trial of Spiriva which may not be cost effective. Sample Dulera 200 instead of Symbicort Flu vaccine

## 2011-11-27 ENCOUNTER — Telehealth: Payer: Self-pay | Admitting: Internal Medicine

## 2011-11-27 MED ORDER — MOMETASONE FURO-FORMOTEROL FUM 200-5 MCG/ACT IN AERO: 2.0000 | INHALATION_SPRAY | Freq: Two times a day (BID) | RESPIRATORY_TRACT | Status: DC

## 2011-11-27 NOTE — Telephone Encounter (Signed)
I spoke with pt and he stated "dulera is the best thing I have ever been on". His breathing is much better and he is not wheezing. He ask an RX be sent into [facility] on [address]. He also wanted to thank [name] for mailing decision to put him on the dulera. Please advise [name] thanks

## 2011-11-27 NOTE — Telephone Encounter (Signed)
Per CY - it's okay to refill Dulera.  Rx has been sent to pharmacy, pt aware.

## 2011-11-28 ENCOUNTER — Telehealth: Payer: Self-pay | Admitting: Internal Medicine

## 2011-11-28 MED ORDER — MOMETASONE FURO-FORMOTEROL FUM 200-5 MCG/ACT IN AERO: 2.0000 | INHALATION_SPRAY | Freq: Two times a day (BID) | RESPIRATORY_TRACT | Status: DC

## 2011-11-28 NOTE — Telephone Encounter (Signed)
Called and spoke with patient, he is aware that Dulera 200 sample left upfront for his pickup. Nothing further needed at this time.

## 2012-03-04 ENCOUNTER — Encounter: Payer: Self-pay | Admitting: Internal Medicine

## 2012-03-18 ENCOUNTER — Other Ambulatory Visit (INDEPENDENT_AMBULATORY_CARE_PROVIDER_SITE_OTHER): Payer: Medicare Other

## 2012-03-18 ENCOUNTER — Encounter: Payer: Self-pay | Admitting: Internal Medicine

## 2012-03-18 ENCOUNTER — Ambulatory Visit (INDEPENDENT_AMBULATORY_CARE_PROVIDER_SITE_OTHER)
Admission: RE | Admit: 2012-03-18 | Discharge: 2012-03-18 | Disposition: A | Discharge status: Home / Self Care | Payer: Medicare Other | Source: Ambulatory Visit | Attending: Internal Medicine | Admitting: Internal Medicine

## 2012-03-18 ENCOUNTER — Ambulatory Visit (INDEPENDENT_AMBULATORY_CARE_PROVIDER_SITE_OTHER): Payer: Medicare Other | Admitting: Internal Medicine

## 2012-03-18 VITALS — BP 150/72 | HR 52 | Ht 69.0 in | Wt 313.2 lb

## 2012-03-18 DIAGNOSIS — J45909 Unspecified asthma, uncomplicated: Secondary | ICD-10-CM

## 2012-03-18 DIAGNOSIS — G4733 Obstructive sleep apnea (adult) (pediatric): Secondary | ICD-10-CM

## 2012-03-18 LAB — CBC
HCT: 43.8 % (ref 39.0–52.0)
MCV: 87.5 fl (ref 78.0–100.0)
Platelets: 304 10*3/uL (ref 150.0–400.0)
RBC: 5.01 Mil/uL (ref 4.22–5.81)

## 2012-03-18 NOTE — Assessment & Plan Note (Signed)
Never clear. It would help to use Dulera twice daily. Discussed cost and alternatives. Plan- He will check with insurance about 74-month ordering.  Lab today- CBC, IgE. CXR

## 2012-03-18 NOTE — Progress Notes (Signed)
Patient ID: [name] [name], male    DOB: 12-13-1934, 78 y.o.   MRN: [id number]  HPI 05/29/10-75 yo M former smoker followed for asthma, sleep apnea, complicated by obesity.  Last here November 28, 2009 with no problems over the winter. 3 weeks ago with sustained time working out in yard he developed by bad sustained cough with wheeze and some phlegm. Current meds gradually controlled. Refers to going to gym, but says with any sustained walking- longer parking lot etc, he has to stop repeatedly to catch his breath. Asks [facility] parking. Today he admits only very occasional cough with trace yellow mucus. Takes zyrtec with good control. He continues compliant with BiPAP, new machine, still on 10/3. He can sleep much better with this one. Still occasional nap in chair.   12/05/10- 41 yo M former smoker followed for asthma, sleep apnea, complicated by obesity.  He continues to feel raspiness in his upper airways. His primary physician gave Singulair which he has taken for 3 months, without evident effect. Using his rescue inhaler about one time per week, off and just before [facility]. He continues Symbicort, 2 puffs once daily. Cost is a problem. Triggers for him include temperature change and brisk walking but he denies any awareness of reflux. He continues using his BiPAP machine 10/3/[facility], all night every night with good control.  04/24/11-  54 yo M former smoker followed for asthma, sleep apnea, complicated by obesity.  Increased shortness of breath/asthma over the past month with gradual progression. Best peak flow was 440 but today she only got 230. After medication she reached 320. Raspy cough and tussive back pain but little sputum. Denies fever. Was using nebulizer albuterol and budesonide. Now [employer] does not want to cover nebulized budesonide. Singulair was no help.  06/05/11- 60 yo M former smoker followed for asthma, sleep apnea, complicated by  obesity.   PCP Dr [name] Wears BiPAP every night for approx 6-8 hours at night; Using nebulizers in morning, using Symbicort at noon and at night. 05-02-11 still had swelling in feet and legs, 05-16-11 began" jucing"-asthma and things were getting better; peak flow was around 450. 05-18-11 was able to "belly laugh" without coughing (first time in years) 05-25-11-was helping with yard work,sat down on deck, later that night had fever and chills(felt almost out of it-almost called 911 due to feeling so bad). Resolved with gradual clearing of sweats over next week on his own. Singulair with no help. "Loves" BiPap.  08/15/11- 36 yo M former smoker followed for asthma, sleep apnea, complicated by obesity.   PCP Dr [name] Pt on Bipap.  uses approx 8 hours 7 days weeks.pt states has some wheezing  cough,. has some gout issues   Resolved acute asthmatic bronchitis at last visit. Since then and long-term, as tended to wheeze, on most days. [name] helped but was too expensive. Medications reviewed. Complains today of gout in right foot-not calf pain.   11/13/11-77 yo M former smoker followed for asthma, sleep apnea, complicated by obesity.   PCP Dr [name] Wears bipap 10/3 [employer],wears avg. 8 hrs.,fitting good,sob increased x  2-3 wks. ,wheezing,no cp or tightness,sweats We called in prednisone in September for acute exacerbation of asthma that helped. Wheeze and cough are gradually coming back. Cough is productive of white sputum. Using Symbicort irregularly and nebulized budesonide only once daily. We discussed the importance of appropriate use of his maintenance controller medications.  03/18/12- -  78 yo M former smoker followed for asthma, sleep apnea, complicated by obesity.   PCP Dr [name] FOLLOWS FOR: still having wheezing and SOB as well(this winter has been tough-not going to the [facility]). Wears BiPAP 10/3 [employer] every night for about 5-10 hours depending on sleep time Has concerns about billing  practices at [place] and would like to explore alternatives. Persistent wheeze tends to be worse in winter. Using neb in AM, Dulera just once daily due to cost. Discussed 31-month ordering and his formulary. Not exercising and has gained weight. Cough disturbs sleep.   Review of Systems-See HPI Constitutional:   No-   weight loss, night sweats, fevers, chills, fatigue, lassitude. HEENT:   No-  headaches, difficulty swallowing, tooth/dental problems, sore throat,       No-  sneezing, itching, ear ache, +nasal congestion, post nasal drip,  CV:  No-   chest pain, orthopnea, PND, swelling in lower extremities, anasarca, dizziness, palpitations Resp: +Persistent  shortness of breath with exertion or at rest.            +  productive cough,  +chronic- nonproductive cough,  No- coughing up of blood.              No-   change in color of mucus.  + wheezing.   Skin: No-   rash or lesions. GI:  No-   heartburn, indigestion, abdominal pain, nausea, vomiting,  GU: MS:  . Pain dorsum right foot Neuro-     nothing unusual Psych:  No- change in mood or affect. No depression or anxiety.  No memory loss.    Objective:   Physical Exam- Similar to last visit: General- Alert, Oriented, Affect-appropriate, Distress- none acute; markedly obese  Skin- rash-none, lesions- none, excoriation- none Lymphadenopathy- none Head- atraumatic            Eyes- Gross vision intact, PERRLA, conjunctivae clear secretions            Ears- Hearing, canals-normal            Nose- Clear, no-Septal dev, mucus, polyps, erosion, perforation             Throat- Mallampati III-IV , mucosa clear , drainage- none, tonsils- atrophic, hoarse Neck- flexible , trachea midline, no stridor , thyroid nl, carotid no bruit Chest - symmetrical excursion , unlabored           Heart/CV- RRR , no murmur , no gallop  , no rub, nl s1 s2                           - JVD- none ,  + 1 to 2+edema, stasis changes- none, varices- none           Lung- +  diffuse bilateral wheeze, unlabored,  no- cough,  dullness-none, rub- none. s.           Chest wall-  Abd-  Br/ Gen/ Rectal- Not done, not indicated Extrem- cyanosis- none, clubbing, none, atrophy- none, strength- nl Neuro- grossly intact to observation

## 2012-03-18 NOTE — Assessment & Plan Note (Signed)
Good compliance and control w/ BIPAP. He asks about changing DME and can talk to [facility] about this. Weight loss would help- he is aware.

## 2012-03-18 NOTE — Patient Instructions (Addendum)
Order- lab- CBC, IgE    Dx asthma with bronchitis  We can continue BIPAP 10/3  [facility]  Order CXR    Dx asthma with bronchitis  See if your insurance will give you a price-break for 3 month mail-order Dulera (or Symbicort)  Order- [facility] discuss switching DME companies for BiPAP for dx OSA

## 2012-03-21 ENCOUNTER — Telehealth: Payer: Self-pay | Admitting: Internal Medicine

## 2012-03-21 MED ORDER — MOMETASONE FURO-FORMOTEROL FUM 200-5 MCG/ACT IN AERO: 2.0000 | INHALATION_SPRAY | Freq: Two times a day (BID) | RESPIRATORY_TRACT | Status: DC

## 2012-03-21 NOTE — Telephone Encounter (Signed)
I spoke with pt. He is aware rx for dulera has been sent. Nothing further was needed

## 2012-03-24 NOTE — Progress Notes (Signed)
Quick Note:  Pt aware of results. ______ 

## 2012-04-22 ENCOUNTER — Other Ambulatory Visit: Payer: Self-pay | Admitting: Internal Medicine

## 2012-04-28 ENCOUNTER — Telehealth: Payer: Self-pay | Admitting: Internal Medicine

## 2012-04-28 NOTE — Telephone Encounter (Signed)
Patient he feels like his head is "stopped up," has started wheezing in which it is waking him up at night, Having Lots of chest congestion that has not improved, having prod cough with quite a bit of mucus. Requesting appt w CY-- none available. Pt has been scheduled to see TP on Tues 04/29/12 @315pm  Patient aware, expressed his gratitude and nothing further needed at this time.

## 2012-04-29 ENCOUNTER — Encounter: Payer: Self-pay | Admitting: Adult Health

## 2012-04-29 ENCOUNTER — Ambulatory Visit (INDEPENDENT_AMBULATORY_CARE_PROVIDER_SITE_OTHER): Payer: Medicare Other | Admitting: Adult Health

## 2012-04-29 VITALS — BP 136/80 | HR 66 | Temp 98.5°F | Ht 69.0 in | Wt 312.8 lb

## 2012-04-29 DIAGNOSIS — J45909 Unspecified asthma, uncomplicated: Secondary | ICD-10-CM

## 2012-04-29 MED ORDER — PREDNISONE 10 MG PO TABS: ORAL_TABLET | ORAL | Status: DC

## 2012-04-29 MED ORDER — AMOXICILLIN-POT CLAVULANATE 875-125 MG PO TABS: 1.0000 | ORAL_TABLET | Freq: Two times a day (BID) | ORAL | Status: AC

## 2012-04-29 NOTE — Progress Notes (Signed)
Patient ID: [name] [name], male    DOB: [birth year], 78 y.o.   MRN: [id number]  HPI 05/29/10-78 yo M former smoker followed for asthma, sleep apnea, complicated by obesity.  Last here November 28, 2009 with no problems over the winter. 3 weeks ago with sustained time working out in yard he developed by bad sustained cough with wheeze and some phlegm. Current meds gradually controlled. Refers to going to gym, but says with any sustained walking- longer parking lot etc, he has to stop repeatedly to catch his breath. [facility]ks [facility] parking. Today he admits only very occasional cough with trace yellow mucus. Takes zyrtec with good control. He continues compliant with BiPAP, new machine, still on 10/3. He can sleep much better with this one. Still occasional nap in chair.   12/05/10- 78 yo M former smoker followed for asthma, sleep apnea, complicated by obesity.  He continues to feel raspiness in his upper airways. His primary physician gave Singulair which he has taken for 3 months, without evident effect. Using his rescue inhaler about one time per week, off and just before he goes to [facility] the [facility]. He continues Symbicort, 2 puffs once daily. Cost is a problem. Triggers for him include temperature change and brisk walking but he denies any awareness of reflux. He continues using his BiPAP machine 10/3/[facility], all night every night with good control.  04/24/11-  78 yo M former smoker followed for asthma, sleep apnea, complicated by obesity.  Increased shortness of breath/asthma over the past month with gradual progression. Best peak flow was 440 but today she only got 230. After medication she reached 320. Raspy cough and tussive back pain but little sputum. Denies fever. Was using nebulizer albuterol and budesonide. Now [employer] insurance does not want to cover nebulized budesonide. Singulair was no help.  06/05/11- 78 yo M former smoker followed for asthma, sleep apnea, complicated by  obesity.   PCP Dr [name] Wears BiPAP every night for approx 6-8 hours at night; Using nebulizers in morning, using Symbicort at noon and at night. 05-02-11 still had swelling in feet and legs, 05-16-11 began" jucing"-asthma and things were getting better; peak flow was around 450. 05-18-11 was able to "belly laugh" without coughing (first time in years) 05-25-11-was helping with yard work,sat down on deck, later that night had fever and chills(felt almost out of it-almost called 911 due to feeling so bad). Resolved with gradual clearing of sweats over next week on his own. Singulair with no help. "Loves" BiPap.  08/15/11- 78 yo M former smoker followed for asthma, sleep apnea, complicated by obesity.   PCP Dr [name] Pt on Bipap.  uses approx 8 hours 7 days weeks.pt states has some wheezing  cough,. has some gout issues   Resolved acute asthmatic bronchitis at last visit. Since then and long-term, as tended to wheeze, on most days. [name] helped but was too expensive. Medications reviewed. Complains today of gout in right foot-not calf pain.   11/13/11-77 yo M former smoker followed for asthma, sleep apnea, complicated by obesity.   PCP Dr [name] Wears bipap 10/3 [employer],wears avg. 8 hrs.,fitting good,sob increased x  2-3 wks. ,wheezing,no cp or tightness,sweats We called in prednisone in September for acute exacerbation of asthma that helped. Wheeze and cough are gradually coming back. Cough is productive of white sputum. Using Symbicort irregularly and nebulized budesonide only once daily. We discussed the importance of appropriate use of his maintenance controller medications.  03/18/12- -  78 yo M former smoker followed for asthma, sleep apnea, complicated by obesity.   PCP Dr [name] FOLLOWS FOR: still having wheezing and SOB as well(this winter has been tough-not going to the [facility]). Wears BiPAP 10/3 [employer] every night for about 5-10 hours depending on sleep time Has concerns about billing  practices at [place] and would like to explore alternatives. Persistent wheeze tends to be worse in winter. Using neb in AM, Dulera just once daily due to cost. Discussed 36-month ordering and his formulary. Not exercising and has gained weight. Cough disturbs sleep.   04/29/2012 Acute OV  Complains of head congestion with light yellow/foamy clear mucus, bilateral ear congestion, PND, wheezing, prod cough with same-colored mucus x2 weeks. Doing well on Dulera until last 2 weeks.  Wheezing is keeping him awake. Cough is worse at night.  Has sinus congestion , drainage , nasal stuffiness, sinus pressure.  Wearing BIPAP At bedtime .  No hemoptysis, fever, or edema.  Mucinex is not helping      Review of Systems-See HPI Constitutional:   No  weight loss, night sweats,  Fevers, chills, fatigue, or  lassitude.  HEENT:   No headaches,  Difficulty swallowing,  Tooth/dental problems, or  Sore throat,                No sneezing, itching, ear ache,  +nasal congestion, post nasal drip,   CV:  No chest pain,  Orthopnea, PND, swelling in lower extremities, anasarca, dizziness, palpitations, syncope.   GI  No heartburn, indigestion, abdominal pain, nausea, vomiting, diarrhea, change in bowel habits, loss of appetite, bloody stools.   Resp :  No chest wall deformity  Skin: no rash or lesions.  GU: no dysuria, change in color of urine, no urgency or frequency.  No flank pain, no hematuria   MS:  No joint pain or swelling.  No decreased range of motion.  No back pain.  Psych:  No change in mood or affect. No depression or anxiety.  No memory loss.        Objective:   Physical Exam-  GEN: A/Ox3; pleasant , NAD, elderly   HEENT:  [place]/AT,  EACs-clear, TMs- full , NOSE-clear, THROAT-clear, no lesions, + postnasal drip or exudate noted.   NECK:  Supple w/ fair ROM; no JVD; normal carotid impulses w/o bruits; no thyromegaly or nodules palpated; no lymphadenopathy.  RESP  Exp wheezing bilaterally  no accessory muscle use, no dullness to percussion  CARD:  RRR, no m/r/g  , no peripheral edema, pulses intact, no cyanosis or clubbing.  GI:   Soft & nt; nml bowel sounds; no organomegaly or masses detected.  Musco: Warm bil, no deformities or joint swelling noted.   Neuro: alert, no focal deficits noted.    Skin: Warm, no lesions or rashes

## 2012-04-29 NOTE — Patient Instructions (Addendum)
Augmentin 875mg  Twice daily  For 10 days , take w/ food .  Mucinex DM Twice daily  As needed  Cough/congestion  Fluids and rest As needed   Saline nasal rinses As needed   Prednisone taper over next week.  Please contact office for sooner follow up if symptoms do not improve or worsen or seek emergency care  Follow up [name]  As planned and As needed

## 2012-04-29 NOTE — Assessment & Plan Note (Signed)
Exacerbation with associated sinusitis   Plan  Augmentin 875mg  Twice daily  For 10 days , take w/ food .  Mucinex DM Twice daily  As needed  Cough/congestion  Fluids and rest As needed   Saline nasal rinses As needed   Prednisone taper over next week.  Please contact office for sooner follow up if symptoms do not improve or worsen or seek emergency care  Follow up [name]  As planned and As needed

## 2012-05-08 ENCOUNTER — Telehealth: Payer: Self-pay | Admitting: Adult Health

## 2012-05-08 MED ORDER — PREDNISONE 10 MG PO TABS: ORAL_TABLET | ORAL | Status: DC

## 2012-05-08 MED ORDER — AMOXICILLIN-POT CLAVULANATE 875-125 MG PO TABS: 1.0000 | ORAL_TABLET | Freq: Two times a day (BID) | ORAL | Status: DC

## 2012-05-08 NOTE — Telephone Encounter (Signed)
Pt seen by TP Symptoms not improving. Pt c/o increased wheezing and still having the symptoms of his sinus infection.  Amox 875 and prednisone Pt needing to go to [place] to see his brother(brother is very sick-losing battle with lung cancer) Pt does not want to go sick-wants an extension on medications if able.  Seen by TP 04/29/2012 Augmentin 875mg  Twice daily For 10 days , take w/ food .  Mucinex DM Twice daily As needed Cough/congestion  Fluids and rest As needed  Saline nasal rinses As needed  Prednisone taper over next week.  Please contact office for sooner follow up if symptoms do not improve or worsen or seek emergency care  Follow up [name] As planned and As needed   No Known Allergies  Please advise Dr Maple Hudson. Thanks.

## 2012-05-08 NOTE — Telephone Encounter (Signed)
Per CY Augmentin 875mg  #20 1 tab BID  Pred 8 day taper 10mg  # 20: 4x 2day, 3 x2day, 2 x2day 1x2days stop   Spoke with patient, made him aware of recs  Rx have been sent to verified pharmacy and nothing further needed at this time.

## 2012-06-05 ENCOUNTER — Other Ambulatory Visit: Payer: Medicare Other

## 2012-06-05 ENCOUNTER — Encounter: Payer: Self-pay | Admitting: Internal Medicine

## 2012-06-05 ENCOUNTER — Ambulatory Visit (INDEPENDENT_AMBULATORY_CARE_PROVIDER_SITE_OTHER): Payer: Medicare Other | Admitting: Internal Medicine

## 2012-06-05 VITALS — BP 150/72 | HR 100 | Ht 69.0 in | Wt 305.4 lb

## 2012-06-05 DIAGNOSIS — J45909 Unspecified asthma, uncomplicated: Secondary | ICD-10-CM

## 2012-06-05 DIAGNOSIS — J309 Allergic rhinitis, unspecified: Secondary | ICD-10-CM

## 2012-06-05 DIAGNOSIS — G4733 Obstructive sleep apnea (adult) (pediatric): Secondary | ICD-10-CM

## 2012-06-05 MED ORDER — AZELASTINE-FLUTICASONE 137-50 MCG/ACT NA SUSP: 1.0000 | Freq: Every day | NASAL | Status: DC

## 2012-06-05 NOTE — Progress Notes (Signed)
Patient ID: [name] [name], male    DOB: [birth year], 78 y.o.   MRN: [id number]  HPI 05/29/10-78 yo M former smoker followed for asthma, sleep apnea, complicated by obesity.  Last here November 28, 2009 with no problems over the winter. 3 weeks ago with sustained time working out in yard he developed by bad sustained cough with wheeze and some phlegm. Current meds gradually controlled. Refers to going to gym, but says with any sustained walking- longer parking lot etc, he has to stop repeatedly to catch his breath. Asks [facility] parking. Today he admits only very occasional cough with trace yellow mucus. Takes zyrtec with good control. He continues compliant with BiPAP, new machine, still on 10/3. He can sleep much better with this one. Still occasional nap in chair.   12/05/10- 78 yo M former smoker followed for asthma, sleep apnea, complicated by obesity.  He continues to feel raspiness in his upper airways. His primary physician gave Singulair which he has taken for 3 months, without evident effect. Using his rescue inhaler about one time per week, off and just before he goes to [facility] the [facility]. He continues Symbicort, 2 puffs once daily. Cost is a problem. Triggers for him include temperature change and brisk walking but he denies any awareness of reflux. He continues using his BiPAP machine 10/3/[facility], all night every night with good control.  04/24/11-  78 yo M former smoker followed for asthma, sleep apnea, complicated by obesity.  Increased shortness of breath/asthma over the past month with gradual progression. Best peak flow was 440 but today she only got 230. After medication she reached 320. Raspy cough and tussive back pain but little sputum. Denies fever. Was using nebulizer albuterol and budesonide. Now [employer] insurance does not want to cover nebulized budesonide. Singulair was no help.  06/05/11- 78 yo M former smoker followed for asthma, sleep apnea, complicated by  obesity.   PCP Dr [name] Wears BiPAP every night for approx 6-8 hours at night; Using nebulizers in morning, using Symbicort at noon and at night. 05-02-11 still had swelling in feet and legs, 05-16-11 began" jucing"-asthma and things were getting better; peak flow was around 450. 05-18-11 was able to "belly laugh" without coughing (first time in years) 05-25-11-was helping with yard work,sat down on deck, later that night had fever and chills(felt almost out of it-almost called 911 due to feeling so bad). Resolved with gradual clearing of sweats over next week on his own. Singulair with no help. "Loves" BiPap.  08/15/11- 78 yo M former smoker followed for asthma, sleep apnea, complicated by obesity.   PCP Dr [name] Pt on Bipap.  uses approx 8 hours 7 days weeks.pt states has some wheezing  cough,. has some gout issues   Resolved acute asthmatic bronchitis at last visit. Since then and long-term, as tended to wheeze, on most days. [name] helped but was too expensive. Medications reviewed. Complains today of gout in right foot-not calf pain.   11/13/11-77 yo M former smoker followed for asthma, sleep apnea, complicated by obesity.   PCP Dr [name] Wears bipap 10/3 [employer],wears avg. 8 hrs.,fitting good,sob increased x  2-3 wks. ,wheezing,no cp or tightness,sweats We called in prednisone in September for acute exacerbation of asthma that helped. Wheeze and cough are gradually coming back. Cough is productive of white sputum. Using Symbicort irregularly and nebulized budesonide only once daily. We discussed the importance of appropriate use of his maintenance controller medications.  03/18/12- -  78 yo M former smoker followed for asthma, sleep apnea, complicated by obesity.   PCP Dr [name] FOLLOWS FOR: still having wheezing and SOB as well(this winter has been tough-not going to the [facility]). Wears BiPAP 10/3 [employer] every night for about 5-10 hours depending on sleep time Has concerns about billing  practices at [place] and would like to explore alternatives. Persistent wheeze tends to be worse in winter. Using neb in AM, Dulera just once daily due to cost. Discussed 70-month ordering and his formulary. Not exercising and has gained weight. Cough disturbs sleep.   04/29/2012 Acute OV  Complains of head congestion with light yellow/foamy clear mucus, bilateral ear congestion, PND, wheezing, prod cough with same-colored mucus x2 weeks. Doing well on Dulera until last 2 weeks.  Wheezing is keeping him awake. Cough is worse at night.  Has sinus congestion , drainage , nasal stuffiness, sinus pressure.  Wearing BIPAP At bedtime .  No hemoptysis, fever, or edema.  Mucinex is not helping   06/05/12- 51 yo M former smoker followed for asthma, sleep apnea, complicated by obesity.   PCP Dr [name] ACUTE VISIT: saw TP 04-2012; can't shake infection off. Was given 2 rds of abx-Augmentin. Can hear him self talking-ears. Also tried sudafed. Nasal pressure/congestion.  NP visit 4/8- treated with Augmentin x2 courses, plus prednisone. This helped.  He visited [place]. Brother was diagnosed lung cancer "Sarcosatoid carcinoma".  Continues BiPAP 10/3 [employer]. CXR 03/24/12 IMPRESSION:  No evidence of acute cardiopulmonary disease.  Original Report Authenticated By: Charline Bills, M.D.   ROS-see HPI Constitutional:   No-   weight loss, night sweats, fevers, chills, +fatigue, lassitude. HEENT:   No-  headaches, difficulty swallowing, tooth/dental problems, sore throat,       No-  sneezing, itching, ear ache, nasal congestion, post nasal drip,  CV:  No-   chest pain, orthopnea, PND, swelling in lower extremities, anasarca, dizziness, palpitations Resp: No-   shortness of breath with exertion or at rest.              No-   productive cough,  No non-productive cough,  No- coughing up of blood.              No-   change in color of mucus.  + wheezing.   Skin: No-   rash or lesions. GI:  No-   heartburn,  indigestion, abdominal pain, nausea, vomiting,  GU: . MS:  No-   joint pain or swelling.  . Neuro-     nothing unusual Psych:  No- change in mood or affect. No depression or anxiety.  No memory loss.      Objective:  OBJ- Physical Exam General- Alert, Oriented, Affect-appropriate, Distress- none acute, morbid obesity Skin- rash-none, lesions- none, excoriation- none Lymphadenopathy- none Head- atraumatic            Eyes- Gross vision intact, PERRLA, conjunctivae and secretions clear            Ears- Hearing, canals-normal            Nose- Clear, no-Septal dev, mucus, polyps, erosion, perforation             Throat- Mallampati II , mucosa clear , drainage- none, tonsils- atrophic Neck- flexible , trachea midline, no stridor , thyroid nl, carotid no bruit Chest - symmetrical excursion , unlabored           Heart/CV- RRR , no murmur , no gallop  , no rub, nl s1 s2                           -  JVD- none , edema- none, stasis changes- none, varices- none           Lung- +raspy on forced exertion, wheeze-+chronic, unlabored, cough- none , dullness-none, rub- none           Chest wall-  Abd-  Br/ Gen/ Rectal- Not done, not indicated Extrem- cyanosis- none, clubbing, none, atrophy- none, strength- nl Neuro- grossly intact to observation

## 2012-06-05 NOTE — Patient Instructions (Addendum)
Order-  Lab- Allergy profile    Dx asthma  Sample Dymista nasal spray    1-2 puffs each nostril once daily at bedtime

## 2012-06-06 LAB — ALLERGY FULL PROFILE
Allergen, D pternoyssinus,d7: <0.1 kU/L
Alternaria Alternata: <0.1 kU/L
Bahia Grass: <0.1 kU/L
Box Elder IgE: <0.1 kU/L
Cat Dander: <0.1 kU/L
Common Ragweed: <0.1 kU/L
Curvularia lunata: <0.1 kU/L
Dog Dander: <0.1 kU/L
Elm IgE: <0.1 kU/L
Fescue: <0.1 kU/L
G009 Red Top: <0.1 kU/L
Helminthosporium halodes: <0.1 kU/L
House Dust Hollister: <0.1 kU/L
Sycamore Tree: <0.1 kU/L

## 2012-06-09 NOTE — Progress Notes (Signed)
Quick Note:  Pt aware of results. ______ 

## 2012-06-09 NOTE — Progress Notes (Signed)
Quick Note:  LMTCB ______ 

## 2012-06-10 ENCOUNTER — Encounter: Payer: Self-pay | Admitting: Internal Medicine

## 2012-06-16 ENCOUNTER — Encounter: Payer: Self-pay | Admitting: Internal Medicine

## 2012-06-16 NOTE — Assessment & Plan Note (Signed)
Good compliance and control. Has been unable to accomplish weight loss

## 2012-06-16 NOTE — Assessment & Plan Note (Signed)
Consider possible Xolair use. Plan-allergy profile

## 2012-06-16 NOTE — Assessment & Plan Note (Signed)
Plan-sample Dymista nasal spray, educated use of Sudafed, allergy profile

## 2012-06-17 ENCOUNTER — Encounter: Payer: Self-pay | Admitting: Internal Medicine

## 2012-06-17 MED ORDER — AZELASTINE-FLUTICASONE 137-50 MCG/ACT NA SUSP: 1.0000 | Freq: Every day | NASAL | Status: DC

## 2012-06-17 NOTE — Telephone Encounter (Signed)
I spoke with the patient and advised I will send refill for dymista. Pt states he is still having the same symptoms that he was having at last OV. He states he is having nasal congestion, head congestion, blowing nose with discolored mucus, chest congestion, and night sweats. Pt states he "just can't seem to shake this." Please advise. Carron Curie, CMA No Known Allergies  Pt ok with email back as response [facility] [employer]

## 2012-06-18 ENCOUNTER — Encounter: Payer: Self-pay | Admitting: Internal Medicine

## 2012-06-18 MED ORDER — FLUTICASONE PROPIONATE 50 MCG/ACT NA SUSP: 2.0000 | Freq: Every day | NASAL | Status: DC

## 2012-06-18 MED ORDER — AZELASTINE HCL 0.1 % NA SOLN: 2.0000 | Freq: Every evening | NASAL | Status: DC | PRN

## 2012-06-18 NOTE — Telephone Encounter (Signed)
Rx's sent to [facility] [address] and message sent to patient.

## 2012-06-20 ENCOUNTER — Encounter: Payer: Self-pay | Admitting: Internal Medicine

## 2012-07-15 ENCOUNTER — Encounter: Payer: Self-pay | Admitting: Internal Medicine

## 2012-07-15 NOTE — Telephone Encounter (Signed)
I have documented this and patient is aware.

## 2012-07-17 ENCOUNTER — Encounter: Payer: Self-pay | Admitting: Internal Medicine

## 2012-07-17 ENCOUNTER — Ambulatory Visit (INDEPENDENT_AMBULATORY_CARE_PROVIDER_SITE_OTHER): Payer: Medicare Other | Admitting: Internal Medicine

## 2012-07-17 VITALS — BP 128/62 | HR 55 | Ht 69.0 in | Wt 306.4 lb

## 2012-07-17 DIAGNOSIS — E669 Obesity, unspecified: Secondary | ICD-10-CM

## 2012-07-17 DIAGNOSIS — J4540 Moderate persistent asthma, uncomplicated: Secondary | ICD-10-CM

## 2012-07-17 DIAGNOSIS — J45909 Unspecified asthma, uncomplicated: Secondary | ICD-10-CM

## 2012-07-17 DIAGNOSIS — R61 Generalized hyperhidrosis: Secondary | ICD-10-CM

## 2012-07-17 DIAGNOSIS — G4733 Obstructive sleep apnea (adult) (pediatric): Secondary | ICD-10-CM

## 2012-07-17 NOTE — Patient Instructions (Addendum)
Order- lab- CBC w/ diff, sed rate    Dx night sweats                    Quant TB assay                      Sputum culture and smear for routine and Fungal   Order- start application process for Xolair  For dx moderate persistent asthma  Schedule return off antihistamines (cetirizine and azelastine nasal spray) x 3 days for aspergillus skin testing

## 2012-07-17 NOTE — Progress Notes (Signed)
Patient ID: [name] [name], male    DOB: [birth year], 78 y.o.   MRN: [id number]  HPI 05/29/10-78 yo M former smoker followed for asthma, sleep apnea, complicated by obesity.  Last here November 28, 2009 with no problems over the winter. 3 weeks ago with sustained time working out in yard he developed by bad sustained cough with wheeze and some phlegm. Current meds gradually controlled. Refers to going to gym, but says with any sustained walking- longer parking lot etc, he has to stop repeatedly to catch his breath. Asks [facility] parking. Today he admits only very occasional cough with trace yellow mucus. Takes zyrtec with good control. He continues compliant with BiPAP, new machine, still on 10/3. He can sleep much better with this one. Still occasional nap in chair.   12/05/10- 78 yo M former smoker followed for asthma, sleep apnea, complicated by obesity.  He continues to feel raspiness in his upper airways. His primary physician gave Singulair which he has taken for 3 months, without evident effect. Using his rescue inhaler about one time per week, off and just before he [facility] the [facility]. He continues Symbicort, 2 puffs once daily. Cost is a problem. Triggers for him include temperature change and brisk walking but he denies any awareness of reflux. He continues using his BiPAP machine 10/3/[employer], all night every night with good control.  04/24/11-  78 yo M former smoker followed for asthma, sleep apnea, complicated by obesity.  Increased shortness of breath/asthma over the past month with gradual progression. Best peak flow was 440 but today she only got 230. After medication she reached 320. Raspy cough and tussive back pain but little sputum. Denies fever. Was using nebulizer albuterol and budesonide. Now [employer] insurance does not want to cover nebulized budesonide. Singulair was no help.  06/05/11- 78 yo M former smoker followed for asthma, sleep apnea, complicated by  obesity.   PCP Dr [name] GlatterStoneking Wears BiPAP every night for approx 6-8 hours at night; Using nebulizers in morning, using Symbicort at noon and at night. 05-02-11 still had swelling in feet and legs, 05-16-11 began" jucing"-asthma and things were getting better; peak flow was around 450. 05-18-11 was able to "belly laugh" without coughing (first time in years) 05-25-11-was helping with yard work,sat down on deck, later that night had fever and chills(felt almost out of it-almost called 911 due to feeling so bad). Resolved with gradual clearing of sweats over next week on his own. Singulair with no help. "Loves" BiPap.  08/15/11- 78 yo M former smoker followed for asthma, sleep apnea, complicated by obesity.   PCP Dr [name] GlatterStoneking Pt on Bipap.  uses approx 8 hours 7 days weeks.pt states has some wheezing  cough,. has some gout issues   Resolved acute asthmatic bronchitis at last visit. Since then and long-term, as tended to wheeze, on most days. [name] [name] helped but was too expensive. Medications reviewed. Complains today of gout in right foot-not calf pain.   11/13/11-77 yo M former smoker followed for asthma, sleep apnea, complicated by obesity.   PCP Dr [name] GlatterStoneking Wears bipap 10/3 [employer],wears avg. 8 hrs.,fitting good,sob increased x  2-3 wks. ,wheezing,no cp or tightness,sweats We called in prednisone in September for acute exacerbation of asthma that helped. Wheeze and cough are gradually coming back. Cough is productive of white sputum. Using Symbicort irregularly and nebulized budesonide only once daily. We discussed the importance of appropriate use of his maintenance controller medications.  03/18/12- -  78 yo M former smoker followed for asthma, sleep apnea, complicated by obesity.   PCP Dr [name] Glatter FOLLOWS FOR: still having wheezing and SOB as well(this winter has been tough-not going to the [facility]). Wears BiPAP 10/3 [employer] every night for about 5-10 hours depending on sleep time Has concerns about billing  practices at [facility] and would like to explore alternatives. Persistent wheeze tends to be worse in winter. Using neb in AM, Dulera just once daily due to cost. Discussed 28-month ordering and his formulary. Not exercising and has gained weight. Cough disturbs sleep.   04/29/2012 Acute OV  Complains of head congestion with light yellow/foamy clear mucus, bilateral ear congestion, PND, wheezing, prod cough with same-colored mucus x2 weeks. Doing well on Dulera until last 2 weeks.  Wheezing is keeping him awake. Cough is worse at night.  Has sinus congestion , drainage , nasal stuffiness, sinus pressure.  Wearing BIPAP At bedtime .  No hemoptysis, fever, or edema.  Mucinex is not helping   06/05/12- 10 yo M former smoker followed for asthma, sleep apnea, complicated by obesity.   PCP Dr [name] Glatter ACUTE VISIT: saw TP 04-2012; can't shake infection off. Was given 2 rds of abx-Augmentin. Can hear him self talking-ears. Also tried sudafed. Nasal pressure/congestion.  NP visit 4/8- treated with Augmentin x2 courses, plus prednisone. This helped.  He visited [place]. Brother was diagnosed lung cancer "Sarcosatoid carcinoma".  Continues BiPAP 10/3 [employer]. CXR 03/24/12 IMPRESSION:  No evidence of acute cardiopulmonary disease.  Original Report Authenticated By: [name] [name], M.D.  07/17/12- 59 yo M former smoker followed for asthma, sleep apnea, complicated by obesity.   PCP Dr [name] [name] for: Breathing has unchanged since his last visit. He states that astelin and fluticasone have helped with nasal congestion. Has occ prod cough, with minimal to moderate clear sputum. He is c/o night sweats for "quite a while now". Flonase & Astelin work well. Still the same productive cough with clear mucus in plugs as he continues Dulera 200. Allergy Profile 06/05/2012-total IgE 193.3 specific elevations only for Aspergillus at low level.   ROS-see HPI Constitutional:   No-   weight loss, +night sweats,  fevers, chills, +fatigue, lassitude. HEENT:   No-  headaches, difficulty swallowing, tooth/dental problems, sore throat,       No-  sneezing, itching, ear ache, +nasal congestion, post nasal drip,  CV:  No-   chest pain, orthopnea, PND, swelling in lower extremities, anasarca, dizziness, palpitations Resp: +shortness of breath with exertion or at rest.             + productive cough,  No non-productive cough,  No- coughing up of blood.              No-   change in color of mucus.  + wheezing.   Skin: No-   rash or lesions. GI:  No-   heartburn, indigestion, abdominal pain, nausea, vomiting,  GU: . MS:  No-   joint pain or swelling.  . Neuro-     nothing unusual Psych:  No- change in mood or affect. No depression or anxiety.  No memory loss.   Objective:  OBJ- Physical Exam General- Alert, Oriented, Affect-appropriate, Distress- none acute, morbid obesity Skin- rash-none, lesions- none, excoriation- none Lymphadenopathy- none Head- atraumatic            Eyes- Gross vision intact, PERRLA, conjunctivae and secretions clear            Ears- Hearing, canals-normal  Nose- Clear, no-Septal dev, mucus, polyps, erosion, perforation             Throat- Mallampati II , mucosa clear , drainage- none, tonsils- atrophic Neck- flexible , trachea midline, no stridor , thyroid nl, carotid no bruit Chest - symmetrical excursion , unlabored           Heart/CV- RRR , no murmur , no gallop  , no rub, nl s1 s2                           - JVD- none , edema- none, stasis changes- none, varices- none           Lung- +raspy on forced exertion, wheeze-+chronic, unlabored, cough+ , dullness-none, rub- none           Chest wall-  Abd-  Br/ Gen/ Rectal- Not done, not indicated Extrem- cyanosis- none, clubbing, none, atrophy- none, strength- nl Neuro- grossly intact to observation

## 2012-07-24 ENCOUNTER — Encounter: Payer: Self-pay | Admitting: Internal Medicine

## 2012-07-24 ENCOUNTER — Other Ambulatory Visit: Payer: Self-pay | Admitting: Internal Medicine

## 2012-07-24 ENCOUNTER — Other Ambulatory Visit (INDEPENDENT_AMBULATORY_CARE_PROVIDER_SITE_OTHER): Payer: Medicare Other

## 2012-07-24 ENCOUNTER — Ambulatory Visit (INDEPENDENT_AMBULATORY_CARE_PROVIDER_SITE_OTHER): Payer: Medicare Other | Admitting: Internal Medicine

## 2012-07-24 VITALS — BP 140/80 | HR 73 | Ht 69.0 in | Wt 308.4 lb

## 2012-07-24 DIAGNOSIS — J4520 Mild intermittent asthma, uncomplicated: Secondary | ICD-10-CM

## 2012-07-24 DIAGNOSIS — J45909 Unspecified asthma, uncomplicated: Secondary | ICD-10-CM

## 2012-07-24 DIAGNOSIS — R61 Generalized hyperhidrosis: Secondary | ICD-10-CM

## 2012-07-24 DIAGNOSIS — G4733 Obstructive sleep apnea (adult) (pediatric): Secondary | ICD-10-CM

## 2012-07-24 LAB — CBC WITH DIFFERENTIAL/PLATELET
Eosinophils Absolute: 0.6 10*3/uL (ref 0.0–0.7)
MCHC: 33.8 g/dL (ref 30.0–36.0)
MCV: 91.2 fl (ref 78.0–100.0)
Monocytes Absolute: 0.9 10*3/uL (ref 0.1–1.0)
Neutrophils Relative %: 58.6 % (ref 43.0–77.0)
Platelets: 308 10*3/uL (ref 150.0–400.0)
RDW: 13.3 % (ref 11.5–14.6)

## 2012-07-24 NOTE — Progress Notes (Signed)
Patient ID: [name] [name], male    DOB: [birth year], 78 y.o.   MRN: [id number]  HPI 05/29/10-78 yo M former smoker followed for asthma, sleep apnea, complicated by obesity.  Last here November 28, 2009 with no problems over the winter. 3 weeks ago with sustained time working out in yard he developed by bad sustained cough with wheeze and some phlegm. Current meds gradually controlled. Refers to going to gym, but says with any sustained walking- longer parking lot etc, he has to stop repeatedly to catch his breath. Asks [facility] parking. Today he admits only very occasional cough with trace yellow mucus. Takes zyrtec with good control. He continues compliant with BiPAP, new machine, still on 10/3. He can sleep much better with this one. Still occasional nap in chair.   12/05/10- 78 yo M former smoker followed for asthma, sleep apnea, complicated by obesity.  He continues to feel raspiness in his upper airways. His primary physician gave Singulair which he has taken for 3 months, without evident effect. Using his rescue inhaler about one time per week, off and just before he goes to [facility] the [facility]. He continues Symbicort, 2 puffs once daily. Cost is a problem. Triggers for him include temperature change and brisk walking but he denies any awareness of reflux. He continues using his BiPAP machine 10/3/[employer], all night every night with good control.  04/24/11-  78 yo M former smoker followed for asthma, sleep apnea, complicated by obesity.  Increased shortness of breath/asthma over the past month with gradual progression. Best peak flow was 440 but today she only got 230. After medication she reached 320. Raspy cough and tussive back pain but little sputum. Denies fever. Was using nebulizer albuterol and budesonide. Now [employer] insurance does not want to cover nebulized budesonide. Singulair was no help.  06/05/11- 78 yo M former smoker followed for asthma, sleep apnea, complicated by  obesity.   PCP Dr [name] Wears BiPAP every night for approx 6-8 hours at night; Using nebulizers in morning, using Symbicort at noon and at night. 05-02-11 still had swelling in feet and legs, 05-16-11 began" jucing"-asthma and things were getting better; peak flow was around 450. 05-18-11 was able to "belly laugh" without coughing (first time in years) 05-25-11-was helping with yard work,sat down on deck, later that night had fever and chills(felt almost out of it-almost called 911 due to feeling so bad). Resolved with gradual clearing of sweats over next week on his own. Singulair with no help. "Loves" BiPap.  08/15/11- 78 yo M former smoker followed for asthma, sleep apnea, complicated by obesity.   PCP Dr [name] Pt on Bipap.  uses approx 8 hours 7 days weeks.pt states has some wheezing  cough,. has some gout issues   Resolved acute asthmatic bronchitis at last visit. Since then and long-term, as tended to wheeze, on most days. [name] helped but was too expensive. Medications reviewed. Complains today of gout in right foot-not calf pain.   11/13/11-77 yo M former smoker followed for asthma, sleep apnea, complicated by obesity.   PCP Dr [name] Wears bipap 10/3 [employer],wears avg. 8 hrs.,fitting good,sob increased x  2-3 wks. ,wheezing,no cp or tightness,sweats We called in prednisone in September for acute exacerbation of asthma that helped. Wheeze and cough are gradually coming back. Cough is productive of white sputum. Using Symbicort irregularly and nebulized budesonide only once daily. We discussed the importance of appropriate use of his maintenance controller medications.  03/18/12- -  78 yo M former smoker followed for asthma, sleep apnea, complicated by obesity.   PCP Dr [name] FOLLOWS FOR: still having wheezing and SOB as well(this winter has been tough-not going to the [facility]). Wears BiPAP 10/3 [employer] every night for about 5-10 hours depending on sleep time Has concerns about billing  practices at [facility] and would like to explore alternatives. Persistent wheeze tends to be worse in winter. Using neb in AM, Dulera just once daily due to cost. Discussed 83-month ordering and his formulary. Not exercising and has gained weight. Cough disturbs sleep.   04/29/2012 Acute OV  Complains of head congestion with light yellow/foamy clear mucus, bilateral ear congestion, PND, wheezing, prod cough with same-colored mucus x2 weeks. Doing well on Dulera until last 2 weeks.  Wheezing is keeping him awake. Cough is worse at night.  Has sinus congestion , drainage , nasal stuffiness, sinus pressure.  Wearing BIPAP At bedtime .  No hemoptysis, fever, or edema.  Mucinex is not helping   06/05/12- 69 yo M former smoker followed for asthma, sleep apnea, complicated by obesity.   PCP Dr [name] ACUTE VISIT: saw TP 04-2012; can't shake infection off. Was given 2 rds of abx-Augmentin. Can hear him self talking-ears. Also tried sudafed. Nasal pressure/congestion.  NP visit 4/8- treated with Augmentin x2 courses, plus prednisone. This helped.  He visited [place]. Brother was diagnosed lung cancer "Sarcosatoid carcinoma".  Continues BiPAP 10/3 [employer]. CXR 03/24/12 IMPRESSION:  No evidence of acute cardiopulmonary disease.  Original Report Authenticated By: Charline Bills, M.D.  07/17/12- 62 yo M former smoker followed for asthma, sleep apnea, complicated by obesity.   PCP Dr Jillene Bucks for: Breathing has unchanged since his last visit. He states that astelin and fluticasone have helped with nasal congestion. Has occ prod cough, with minimal to moderate clear sputum. He is c/o night sweats for "quite a while now".  07/24/12-  48 yo M former smoker followed for asthma, sleep apnea, complicated by obesity.   PCP Dr [name] allergic rhinitis---here for asper/mold test Allergy Profile-total IgE 193.3 with specific elevations only for Aspergillus Skin test for Aspergillus with  Controls 07/24/12-  positive for histamine but negative for Aspergillus We are completing application for Xolair after appropriate discussion. Sputum is being sent for fungal, routine and AFB smear and culture and Quant TB assay is ordered.  ROS-see HPI Constitutional:   No-   weight loss, night sweats, fevers, chills, +fatigue, lassitude. HEENT:   No-  headaches, difficulty swallowing, tooth/dental problems, sore throat,       No-  sneezing, itching, ear ache, nasal congestion, post nasal drip,  CV:  No-   chest pain, orthopnea, PND, swelling in lower extremities, anasarca, dizziness, palpitations Resp: + shortness of breath with exertion or at rest.              + productive cough,  No non-productive cough,  No- coughing up of blood.              No-   change in color of mucus.  + wheezing.   Skin: No-   rash or lesions. GI:  No-   heartburn, indigestion, abdominal pain, nausea, vomiting,  GU: . MS:  No-   joint pain or swelling.  . Neuro-     nothing unusual Psych:  No- change in mood or affect. No depression or anxiety.  No memory loss.   Objective:  OBJ- Physical Exam General- Alert, Oriented, Affect-appropriate, Distress- none acute, morbid obesity Skin-  rash-none, lesions- none, excoriation- none Lymphadenopathy- none Head- atraumatic            Eyes- Gross vision intact, PERRLA, conjunctivae and secretions clear            Ears- Hearing, canals-normal            Nose- Clear, no-Septal dev, mucus, polyps, erosion, perforation             Throat- Mallampati II , mucosa clear , drainage- none, tonsils- atrophic Neck- flexible , trachea midline, no stridor , thyroid nl, carotid no bruit Chest - symmetrical excursion , unlabored           Heart/CV- RRR , no murmur , no gallop  , no rub, nl s1 s2                           - JVD- none , edema- none, stasis changes- none, varices- none           Lung- +raspy on forced exertion, wheeze-+chronic, unlabored, +hard, voluntary cough to induce sputum leaving  residual wheezy rhonchi , dullness-none, rub- none           Chest wall-  Abd-  Br/ Gen/ Rectal- Not done, not indicated Extrem- cyanosis- none, clubbing, none, atrophy- none, strength- nl Neuro- grossly intact to observation

## 2012-07-24 NOTE — Patient Instructions (Addendum)
Order- Add to existing sputum culture orders-  AFB smear and culture   Dx asthma with bronchitis  Order- lab draw the Quant TB Gold assay ordered last visit    Dx asthma with chronic bronchitis  We will complete initiation of application process to look into Xolair anti IgE injections for you  You can restart the fluticasone and azelastine nasal sprays whenever you begin to feel you need them

## 2012-07-25 NOTE — Assessment & Plan Note (Signed)
He again reports that compliance and control are good with no changes needed. Weight loss is again encouraged

## 2012-07-25 NOTE — Assessment & Plan Note (Addendum)
Negative skin test makes Allergic Bronchopulmonary Aspergillosis less likely. Plan-complete sputum cultures and rule out TB.  Xolair dose based on weight 139 KG,  IgE 193 = 225 mg every 2 weeks

## 2012-07-27 LAB — RESPIRATORY CULTURE OR RESPIRATORY AND SPUTUM CULTURE: Organism ID, Bacteria: NORMAL

## 2012-07-28 LAB — QUANTIFERON TB GOLD ASSAY (BLOOD)
Interferon Gamma Release Assay: NEGATIVE
TB Ag value: 0.05 IU/mL

## 2012-08-02 NOTE — Assessment & Plan Note (Signed)
Good compliance and control with BiPAP. Weight loss would help as discussed.

## 2012-08-02 NOTE — Assessment & Plan Note (Signed)
He probably has obesity with hypoventilation syndrome.

## 2012-08-02 NOTE — Assessment & Plan Note (Addendum)
His elevated total IgE suggest he may respond to Xolair. Examination suggests a chronic asthmatic bronchitis. Considered ABPA, but doubtful. Plan-Xolair application. Lab for TB assay

## 2012-08-13 MED ORDER — OMALIZUMAB 150 MG ~~LOC~~ SOLR: 225.0000 mg | SUBCUTANEOUS | Status: DC

## 2012-08-13 NOTE — Addendum Note (Signed)
Addended by: Jetty Duhamel D on: 08/13/2012 09:20 AM   Modules accepted: Orders

## 2012-08-24 LAB — FUNGUS CULTURE W SMEAR: Smear Result: NONE SEEN

## 2012-09-08 ENCOUNTER — Telehealth: Payer: Self-pay | Admitting: Internal Medicine

## 2012-09-08 NOTE — Telephone Encounter (Signed)
Lynette from [facility] called report on patient-- Patients AFB resulted [id number]  Will forward to [name] so that he is aware, also placed phone note and lab results on his cart

## 2012-09-19 NOTE — Telephone Encounter (Signed)
Noted  

## 2012-09-30 ENCOUNTER — Ambulatory Visit (INDEPENDENT_AMBULATORY_CARE_PROVIDER_SITE_OTHER): Payer: Medicare Other | Admitting: Internal Medicine

## 2012-09-30 ENCOUNTER — Encounter: Payer: Self-pay | Admitting: Internal Medicine

## 2012-09-30 VITALS — BP 138/82 | HR 52 | Ht 69.0 in | Wt 307.8 lb

## 2012-09-30 DIAGNOSIS — G4733 Obstructive sleep apnea (adult) (pediatric): Secondary | ICD-10-CM

## 2012-09-30 DIAGNOSIS — J45909 Unspecified asthma, uncomplicated: Secondary | ICD-10-CM

## 2012-09-30 DIAGNOSIS — Z23 Encounter for immunization: Secondary | ICD-10-CM

## 2012-09-30 NOTE — Patient Instructions (Addendum)
We will continue to try to help clarify Xolair costs, in case you choose to start  Flu vax  Please call as needed

## 2012-09-30 NOTE — Progress Notes (Signed)
Patient ID: [name] [name], male    DOB: [birth year], 78 y.o.   MRN: [id number]  HPI 05/29/10-78 yo M former smoker followed for asthma, sleep apnea, complicated by obesity.  Last here November 28, 2009 with no problems over the winter. 3 weeks ago with sustained time working out in yard he developed by bad sustained cough with wheeze and some phlegm. Current meds gradually controlled. Refers to going to gym, but says with any sustained walking- longer parking lot etc, he has to stop repeatedly to catch his breath. Asks [facility] parking. Today he admits only very occasional cough with trace yellow mucus. Takes zyrtec with good control. He continues compliant with BiPAP, new machine, still on 10/3. He can sleep much better with this one. Still occasional nap in chair.   12/05/10- 78 yo M former smoker followed for asthma, sleep apnea, complicated by obesity.  He continues to feel raspiness in his upper airways. His primary physician gave Singulair which he has taken for 3 months, without evident effect. Using his rescue inhaler about one time per week, off and just before he [facility] to [facility] the [facility]. He continues Symbicort, 2 puffs once daily. Cost is a problem. Triggers for him include temperature change and brisk walking but he denies any awareness of reflux. He continues using his BiPAP machine 10/3/[employer], all night every night with good control.  04/24/11-  78 yo M former smoker followed for asthma, sleep apnea, complicated by obesity.  Increased shortness of breath/asthma over the past month with gradual progression. Best peak flow was 440 but today she only got 230. After medication she reached 320. Raspy cough and tussive back pain but little sputum. Denies fever. Was using nebulizer albuterol and budesonide. Now [employer] insurance does not want to cover nebulized budesonide. Singulair was no help.  06/05/11- 78 yo M former smoker followed for asthma, sleep apnea, complicated by  obesity.   PCP Dr [name] Wears BiPAP every night for approx 6-8 hours at night; Using nebulizers in morning, using Symbicort at noon and at night. 05-02-11 still had swelling in feet and legs, 05-16-11 began" jucing"-asthma and things were getting better; peak flow was around 450. 05-18-11 was able to "belly laugh" without coughing (first time in years) 05-25-11-was helping with yard work,sat down on deck, later that night had fever and chills(felt almost out of it-almost called 911 due to feeling so bad). Resolved with gradual clearing of sweats over next week on his own. Singulair with no help. "Loves" BiPap.  08/15/11- 78 yo M former smoker followed for asthma, sleep apnea, complicated by obesity.   PCP Dr [name] Pt on Bipap.  uses approx 8 hours 7 days weeks.pt states has some wheezing  cough,. has some gout issues   Resolved acute asthmatic bronchitis at last visit. Since then and long-term, as tended to wheeze, on most days. [name] helped but was too expensive. Medications reviewed. Complains today of gout in right foot-not calf pain.   11/13/11-77 yo M former smoker followed for asthma, sleep apnea, complicated by obesity.   PCP Dr [name] Wears bipap 10/3 [employer],wears avg. 8 hrs.,fitting good,sob increased x  2-3 wks. ,wheezing,no cp or tightness,sweats We called in prednisone in September for acute exacerbation of asthma that helped. Wheeze and cough are gradually coming back. Cough is productive of white sputum. Using Symbicort irregularly and nebulized budesonide only once daily. We discussed the importance of appropriate use of his maintenance controller medications.  03/18/12- -  78 yo M former smoker followed for asthma, sleep apnea, complicated by obesity.   PCP Dr [name] FOLLOWS FOR: still having wheezing and SOB as well(this winter has been tough-not going to the [facility]). Wears BiPAP 10/3 [employer] every night for about 5-10 hours depending on sleep time Has concerns about billing  practices at [place] and would like to explore alternatives. Persistent wheeze tends to be worse in winter. Using neb in AM, Dulera just once daily due to cost. Discussed 56-month ordering and his formulary. Not exercising and has gained weight. Cough disturbs sleep.   04/29/2012 Acute OV  Complains of head congestion with light yellow/foamy clear mucus, bilateral ear congestion, PND, wheezing, prod cough with same-colored mucus x2 weeks. Doing well on Dulera until last 2 weeks.  Wheezing is keeping him awake. Cough is worse at night.  Has sinus congestion , drainage , nasal stuffiness, sinus pressure.  Wearing BIPAP At bedtime .  No hemoptysis, fever, or edema.  Mucinex is not helping   06/05/12- 89 yo M former smoker followed for asthma, sleep apnea, complicated by obesity.   PCP Dr [name] ACUTE VISIT: saw TP 04-2012; can't shake infection off. Was given 2 rds of abx-Augmentin. Can hear him self talking-ears. Also tried sudafed. Nasal pressure/congestion.  NP visit 4/8- treated with Augmentin x2 courses, plus prednisone. This helped.  He visited [place]. Brother was diagnosed lung cancer "Sarcosatoid carcinoma".  Continues BiPAP 10/3 [employer]. CXR 03/24/12 IMPRESSION:  No evidence of acute cardiopulmonary disease.  Original Report Authenticated By: Charline Bills, M.D.  07/17/12- 61 yo M former smoker followed for asthma, sleep apnea, complicated by obesity.   PCP Dr Jillene Bucks for: Breathing has unchanged since his last visit. He states that astelin and fluticasone have helped with nasal congestion. Has occ prod cough, with minimal to moderate clear sputum. He is c/o night sweats for "quite a while now".  07/24/12-  66 yo M former smoker followed for asthma, sleep apnea, complicated by obesity.   PCP Dr [name] allergic rhinitis---here for asper/mold test Allergy Profile-total IgE 193.3 with specific elevations only for Aspergillus Skin test for Aspergillus with  Controls 07/24/12-  positive for histamine but negative for Aspergillus We are completing application for Xolair after appropriate discussion. Sputum is being sent for fungal, routine and AFB smear and culture and Quant TB assay is ordered.  09/30/12- 44 yo M former smoker followed for asthma, sleep apnea, complicated by obesity.   PCP Dr [name] allergic rhinitis- FOLLOWS FOR: Symptoms unchanged since last OV. Wearing BIPAP 10/3/ [employer], 8 hours per night.  Would like to discuss getting supplies  Cough is productive white/yellow. Grew atypical- discussed.  Dulera ok. Nebulizer little help. Pending echocardiogram. Metformin now for borderline DM. We talked about Xolair again but it is too expensive.  ROS-see HPI Constitutional:   No-   weight loss, night sweats, fevers, chills, +fatigue, lassitude. HEENT:   No-  headaches, difficulty swallowing, tooth/dental problems, sore throat,       No-  sneezing, itching, ear ache, nasal congestion, post nasal drip,  CV:  No-   chest pain, orthopnea, PND, swelling in lower extremities, anasarca, dizziness, palpitations Resp: + shortness of breath with exertion or at rest.              + productive cough,  No non-productive cough,  No- coughing up of blood.              No-   change in color of mucus.  +  wheezing.   Skin: No-   rash or lesions. GI:  No  heartburn, indigestion, abdominal pain, nausea, vomiting,  GU: . MS:  No-   joint pain or swelling.  . Neuro-     nothing unusual Psych:  No- change in mood or affect. No depression or anxiety.  No memory loss.   Objective:  OBJ- Physical Exam General- Alert, Oriented, Affect-appropriate, Distress- none acute, morbid obesity Skin- rash-none, lesions- none, excoriation- none Lymphadenopathy- none Head- atraumatic            Eyes- Gross vision intact, PERRLA, conjunctivae and secretions clear            Ears- Hearing, canals-normal            Nose- Clear, no-Septal dev, mucus, polyps, erosion, perforation              Throat- Mallampati II , mucosa clear , drainage- none, tonsils- atrophic Neck- flexible , trachea midline, no stridor , thyroid nl, carotid no bruit Chest - symmetrical excursion , unlabored           Heart/CV- RRR , no murmur , no gallop  , no rub, nl s1 s2                           - JVD- none , edema- none, stasis changes- none, varices- none           Lung- +raspy on forced exertion, wheeze-+chronic, unlabored, dullness-none, rub- none           Chest wall-  Abd-  Br/ Gen/ Rectal- Not done, not indicated Extrem- cyanosis- none, clubbing, none, atrophy- none, strength- nl Neuro- grossly intact to observation

## 2012-10-01 ENCOUNTER — Encounter: Payer: Self-pay | Admitting: Internal Medicine

## 2012-10-01 NOTE — Telephone Encounter (Signed)
Will forward to Katie to respond to the pt

## 2012-10-01 NOTE — Telephone Encounter (Signed)
FYI for CY; I have responded to patient through email already.

## 2012-10-05 NOTE — Assessment & Plan Note (Signed)
We will continue to manage as before. Xolair might become an option in the future if financing is better Plan-flu vaccine

## 2012-10-05 NOTE — Assessment & Plan Note (Signed)
He will discuss needed supplies with Christoper Allegra

## 2012-10-17 LAB — REFERRED ASSAY

## 2012-10-27 ENCOUNTER — Telehealth: Payer: Self-pay | Admitting: Internal Medicine

## 2012-10-27 MED ORDER — PREDNISONE 10 MG PO TABS: ORAL_TABLET | ORAL | Status: DC

## 2012-10-27 NOTE — Telephone Encounter (Signed)
Pt aware and RX sent

## 2012-10-27 NOTE — Telephone Encounter (Signed)
I spoke with [name] [name]. He reports he has lots of coughing w/ clear phlem, wheezing, chest tx, PND, chest and nasal congestion x getting worse for couple weeks. He is requesting RX for prednisone. Please advise [name] thanks Last OV 09/30/12 No Known Allergies

## 2012-10-27 NOTE — Telephone Encounter (Signed)
Per CY-okay to give Prednisone 10 mg #20 take 4 x 2 days, 3 x 2 days, 2 x 2 days, 1 x 2 days then stop no refills.

## 2012-12-30 ENCOUNTER — Ambulatory Visit (INDEPENDENT_AMBULATORY_CARE_PROVIDER_SITE_OTHER): Payer: Medicare Other | Admitting: Internal Medicine

## 2012-12-30 ENCOUNTER — Encounter: Payer: Self-pay | Admitting: Internal Medicine

## 2012-12-30 VITALS — BP 148/72 | HR 52 | Ht 69.0 in | Wt 312.6 lb

## 2012-12-30 DIAGNOSIS — J4540 Moderate persistent asthma, uncomplicated: Secondary | ICD-10-CM

## 2012-12-30 DIAGNOSIS — G4733 Obstructive sleep apnea (adult) (pediatric): Secondary | ICD-10-CM

## 2012-12-30 DIAGNOSIS — J45909 Unspecified asthma, uncomplicated: Secondary | ICD-10-CM

## 2012-12-30 MED ORDER — UMECLIDINIUM-VILANTEROL 62.5-25 MCG/INH IN AEPB: 1.0000 | INHALATION_SPRAY | Freq: Once | RESPIRATORY_TRACT | Status: DC

## 2012-12-30 NOTE — Progress Notes (Signed)
Patient ID: [name] [name], male    DOB: [birth year], 78 y.o.   MRN: [id number]  HPI 05/29/10-78 yo M former smoker followed for asthma, sleep apnea, complicated by obesity.  Last here November 28, 2009 with no problems over the winter. 3 weeks ago with sustained time working out in yard he developed by bad sustained cough with wheeze and some phlegm. Current meds gradually controlled. Refers to going to gym, but says with any sustained walking- longer parking lot etc, he has to stop repeatedly to catch his breath. [facility]ks [facility] parking. Today he admits only very occasional cough with trace yellow mucus. Takes zyrtec with good control. He continues compliant with BiPAP, new machine, still on 10/3. He can sleep much better with this one. Still occasional nap in chair.   12/05/10- 78 yo M former smoker followed for asthma, sleep apnea, complicated by obesity.  He continues to feel raspiness in his upper airways. His primary physician gave Singulair which he has taken for 3 months, without evident effect. Using his rescue inhaler about one time per week, off and just before he goes to [facility] the [facility]. He continues Symbicort, 2 puffs once daily. Cost is a problem. Triggers for him include temperature change and brisk walking but he denies any awareness of reflux. He continues using his BiPAP machine 10/3/[employer], all night every night with good control.  04/24/11-  78 yo M former smoker followed for asthma, sleep apnea, complicated by obesity.  Increased shortness of breath/asthma over the past month with gradual progression. Best peak flow was 440 but today she only got 230. After medication she reached 320. Raspy cough and tussive back pain but little sputum. Denies fever. Was using nebulizer albuterol and budesonide. Now [employer] insurance does not want to cover nebulized budesonide. Singulair was no help.  06/05/11- 78 yo M former smoker followed for asthma, sleep apnea, complicated by  obesity.   PCP Dr [name] Wears BiPAP every night for approx 6-8 hours at night; Using nebulizers in morning, using Symbicort at noon and at night. 05-02-11 still had swelling in feet and legs, 05-16-11 began" jucing"-asthma and things were getting better; peak flow was around 450. 05-18-11 was able to "belly laugh" without coughing (first time in years) 05-25-11-was helping with yard work,sat down on deck, later that night had fever and chills(felt almost out of it-almost called 911 due to feeling so bad). Resolved with gradual clearing of sweats over next week on his own. Singulair with no help. "Loves" BiPap.  08/15/11- 78 yo M former smoker followed for asthma, sleep apnea, complicated by obesity.   PCP Dr [name] Pt on Bipap.  uses approx 8 hours 7 days weeks.pt states has some wheezing  cough,. has some gout issues   Resolved acute asthmatic bronchitis at last visit. Since then and long-term, as tended to wheeze, on most days. [name] [name] helped but was too expensive. Medications reviewed. Complains today of gout in right foot-not calf pain.   11/13/11-78 yo M former smoker followed for asthma, sleep apnea, complicated by obesity.   PCP Dr [name] Wears bipap 10/3 [employer],wears avg. 8 hrs.,fitting good,sob increased x  2-3 wks. ,wheezing,no cp or tightness,sweats We called in prednisone in September for acute exacerbation of asthma that helped. Wheeze and cough are gradually coming back. Cough is productive of white sputum. Using Symbicort irregularly and nebulized budesonide only once daily. We discussed the importance of appropriate use of his maintenance controller medications.  03/18/12- -  78 yo M former smoker followed for asthma, sleep apnea, complicated by obesity.   PCP Dr [name] FOLLOWS FOR: still having wheezing and SOB as well(this winter has been tough-not going to the [facility]). Wears BiPAP 10/3 [employer] every night for about 5-10 hours depending on sleep time Has concerns about billing  practices at [place] and would like to explore alternatives. Persistent wheeze tends to be worse in winter. Using neb in AM, Dulera just once daily due to cost. Discussed 64-month ordering and his formulary. Not exercising and has gained weight. Cough disturbs sleep.   04/29/2012 Acute OV  Complains of head congestion with light yellow/foamy clear mucus, bilateral ear congestion, PND, wheezing, prod cough with same-colored mucus x2 weeks. Doing well on Dulera until last 2 weeks.  Wheezing is keeping him awake. Cough is worse at night.  Has sinus congestion , drainage , nasal stuffiness, sinus pressure.  Wearing BIPAP At bedtime .  No hemoptysis, fever, or edema.  Mucinex is not helping   06/05/12- 78 yo M former smoker followed for asthma, sleep apnea, complicated by obesity.   PCP Dr [name] ACUTE VISIT: saw TP 04-2012; can't shake infection off. Was given 2 rds of abx-Augmentin. Can hear him self talking-ears. Also tried sudafed. Nasal pressure/congestion.  NP visit 4/8- treated with Augmentin x2 courses, plus prednisone. This helped.  He visited [place]. Brother was diagnosed lung cancer "Sarcosatoid carcinoma".  Continues BiPAP 10/3 [employer]. CXR 03/24/12 IMPRESSION:  No evidence of acute cardiopulmonary disease.  Original Report Authenticated By: Charline Bills, M.D.  07/17/12- 78 yo M former smoker followed for asthma, sleep apnea, complicated by obesity.   PCP Dr Jillene Bucks for: Breathing has unchanged since his last visit. He states that astelin and fluticasone have helped with nasal congestion. Has occ prod cough, with minimal to moderate clear sputum. He is c/o night sweats for "quite a while now".  07/24/12-  78 yo M former smoker followed for asthma, sleep apnea, complicated by obesity.   PCP Dr [name] allergic rhinitis---here for asper/mold test Allergy Profile-total IgE 193.3 with specific elevations only for Aspergillus Skin test for Aspergillus with  Controls 07/24/12-  positive for histamine but negative for Aspergillus We are completing application for Xolair after appropriate discussion. Sputum is being sent for fungal, routine and AFB smear and culture and Quant TB assay is ordered.  09/30/12- 10 yo M former smoker followed for asthma, sleep apnea, complicated by obesity.   PCP Dr [name] allergic rhinitis- FOLLOWS FOR: Symptoms unchanged since last OV. Wearing BIPAP 10/3/ [employer], 8 hours per night.  Would like to discuss getting supplies  Cough is productive white/yellow. Grew atypical- discussed.  Dulera ok. Nebulizer little help. Pending echocardiogram. Metformin now for borderline DM. We talked about Xolair again but it is too expensive.  12/30/12- 49 yo M former smoker followed for asthma, sleep apnea, complicated by obesity.   PCP Dr [name] allergic rhinitis-  Cx Tsukamurella sp similar to [facility] FOLLOWS FOR: has bouts of coughing spells; good and bad days overall. Has not used nebulizers in about 3 months or so; could not tell they were helping. BIPAP 10/3/ [employer] Gained weight on vacation. Activity more limited now by arthritis in knees. Still paroxysmal wheeze and cough without change in pattern. Cough can be productive with Mucinex but sputum is usually white. Failed Spiriva. Xolair was too expensive. [name] [name] is better than nothing.  ROS-see HPI Constitutional:   No-   weight loss, night sweats, fevers, chills, +fatigue, lassitude. HEENT:  No-  headaches, difficulty swallowing, tooth/dental problems, sore throat,       No-  sneezing, itching, ear ache, nasal congestion, post nasal drip,  CV:  No-   chest pain, orthopnea, PND, swelling in lower extremities, anasarca, dizziness, palpitations Resp: + shortness of breath with exertion or at rest.              + productive cough,  No non-productive cough,  No- coughing up of blood.              No-   change in color of mucus.  + wheezing.   Skin: No-   rash or lesions. GI:  No  heartburn,  indigestion, abdominal pain, nausea, vomiting,  GU: . MS:  No-   joint pain or swelling.  . Neuro-     nothing unusual Psych:  No- change in mood or affect. No depression or anxiety.  No memory loss.   Objective:  OBJ- Physical Exam General- Alert, Oriented, Affect-appropriate, Distress- none acute, morbid obesity Skin- rash-none, lesions- none, excoriation- none Lymphadenopathy- none Head- atraumatic            Eyes- Gross vision intact, PERRLA, conjunctivae and secretions clear            Ears- Hearing, canals-normal            Nose- Clear, no-Septal dev, mucus, polyps, erosion, perforation             Throat- Mallampati II , mucosa clear , drainage- none, tonsils- atrophic Neck- flexible , trachea midline, no stridor , thyroid nl, carotid no bruit Chest - symmetrical excursion , unlabored           Heart/CV- RRR , no murmur , no gallop  , no rub, nl s1 s2                           - JVD- none , edema- none, stasis changes- none, varices- none           Lung- +raspy on forced exertion, wheeze-+mild chronic, unlabored, dullness-none, rub- none           Chest wall-  Abd-  Br/ Gen/ Rectal- Not done, not indicated Extrem- cyanosis- none, clubbing, none, atrophy- none, strength- nl Neuro- grossly intact to observation

## 2012-12-30 NOTE — Patient Instructions (Signed)
Sample x 2 Anoro inhaler   1 puff, once daily   Try this for comparison instead of Dulera. When it runs out, go back to [facility]

## 2013-01-21 NOTE — Assessment & Plan Note (Signed)
Disappointing that we don't see more response subjectively to bronchodilator. He hasn't had anything he felt was worth using Plan- try Anoro instead of Dulera for comparison  2 samples

## 2013-01-21 NOTE — Assessment & Plan Note (Signed)
He continues compliant use but is unable to achieve any weight loss.

## 2013-01-23 ENCOUNTER — Encounter: Payer: Self-pay | Admitting: Internal Medicine

## 2013-01-30 ENCOUNTER — Telehealth: Payer: Self-pay | Admitting: Internal Medicine

## 2013-01-30 ENCOUNTER — Other Ambulatory Visit: Payer: Self-pay | Admitting: Internal Medicine

## 2013-01-30 MED ORDER — MOMETASONE FURO-FORMOTEROL FUM 200-5 MCG/ACT IN AERO: 2.0000 | INHALATION_SPRAY | Freq: Two times a day (BID) | RESPIRATORY_TRACT | Status: DC

## 2013-01-30 NOTE — Telephone Encounter (Signed)
90 day supply sent electronically to [facility].

## 2013-02-02 NOTE — Telephone Encounter (Signed)
Refill sent on 01-31-12. Pt is aware and will call if he needs a sample to last until shipment arrives. Carron CurieJennifer Prather Failla, CMA

## 2013-03-17 ENCOUNTER — Ambulatory Visit: Payer: Medicare Other | Admitting: Physical Therapy

## 2013-03-25 ENCOUNTER — Ambulatory Visit: Payer: Medicare Other | Attending: Geriatric Medicine | Admitting: Physical Therapy

## 2013-03-25 DIAGNOSIS — M25519 Pain in unspecified shoulder: Secondary | ICD-10-CM | POA: Insufficient documentation

## 2013-03-25 DIAGNOSIS — IMO0001 Reserved for inherently not codable concepts without codable children: Secondary | ICD-10-CM | POA: Insufficient documentation

## 2013-03-31 ENCOUNTER — Ambulatory Visit: Payer: Medicare Other

## 2013-04-01 ENCOUNTER — Ambulatory Visit (INDEPENDENT_AMBULATORY_CARE_PROVIDER_SITE_OTHER): Payer: Medicare Other | Admitting: Internal Medicine

## 2013-04-01 ENCOUNTER — Encounter: Payer: Self-pay | Admitting: Internal Medicine

## 2013-04-01 VITALS — BP 132/70 | HR 55 | Ht 69.0 in | Wt 316.0 lb

## 2013-04-01 DIAGNOSIS — G4733 Obstructive sleep apnea (adult) (pediatric): Secondary | ICD-10-CM

## 2013-04-01 DIAGNOSIS — J45909 Unspecified asthma, uncomplicated: Secondary | ICD-10-CM

## 2013-04-01 NOTE — Patient Instructions (Signed)
We can continue Dulera 200  Please call as needed

## 2013-04-01 NOTE — Progress Notes (Signed)
Patient ID: [name] [name], male    DOB: [birth year], 79 y.o.   MRN: [id number]  HPI 05/29/10-79 yo M former smoker followed for asthma, sleep apnea, complicated by obesity.  Last here November 28, 2009 with no problems over the winter. 3 weeks ago with sustained time working out in yard he developed by bad sustained cough with wheeze and some phlegm. Current meds gradually controlled. Refers to going to gym, but says with any sustained walking- longer parking lot etc, he has to stop repeatedly to catch his breath. Asks [facility] parking. Today he admits only very occasional cough with trace yellow mucus. Takes zyrtec with good control. He continues compliant with BiPAP, new machine, still on 10/3. He can sleep much better with this one. Still occasional nap in chair.   12/05/10- 79 yo M former smoker followed for asthma, sleep apnea, complicated by obesity.  He continues to feel raspiness in his upper airways. His primary physician gave Singulair which he has taken for 3 months, without evident effect. Using his rescue inhaler about one time per week, off and just before he goes to [facility] the [facility]. He continues Symbicort, 2 puffs once daily. Cost is a problem. Triggers for him include temperature change and brisk walking but he denies any awareness of reflux. He continues using his BiPAP machine 10/3/[facility], all night every night with good control.  04/24/11-  79 yo M former smoker followed for asthma, sleep apnea, complicated by obesity.  Increased shortness of breath/asthma over the past month with gradual progression. Best peak flow was 440 but today she only got 230. After medication she reached 320. Raspy cough and tussive back pain but little sputum. Denies fever. Was using nebulizer albuterol and budesonide. Now [employer] insurance does not want to cover nebulized budesonide. Singulair was no help.  06/05/11- 79 yo M former smoker followed for asthma, sleep apnea, complicated by  obesity.   PCP Dr [name] Wears BiPAP every night for approx 6-8 hours at night; Using nebulizers in morning, using Symbicort at noon and at night. 05-02-11 still had swelling in feet and legs, 05-16-11 began" jucing"-asthma and things were getting better; peak flow was around 450. 05-18-11 was able to "belly laugh" without coughing (first time in years) 05-25-11-was helping with yard work,sat down on deck, later that night had fever and chills(felt almost out of it-almost called 911 due to feeling so bad). Resolved with gradual clearing of sweats over next week on his own. Singulair with no help. "Loves" BiPap.  08/15/11- 79 yo M former smoker followed for asthma, sleep apnea, complicated by obesity.   PCP Dr [name] Pt on Bipap.  uses approx 8 hours 7 days weeks.pt states has some wheezing  cough,. has some gout issues   Resolved acute asthmatic bronchitis at last visit. Since then and long-term, as tended to wheeze, on most days. [name] helped but was too expensive. Medications reviewed. Complains today of gout in right foot-not calf pain.   11/13/11-77 yo M former smoker followed for asthma, sleep apnea, complicated by obesity.   PCP Dr [name] Wears bipap 10/3 [facility],wears avg. 8 hrs.,fitting good,sob increased x  2-3 wks. ,wheezing,no cp or tightness,sweats We called in prednisone in September for acute exacerbation of asthma that helped. Wheeze and cough are gradually coming back. Cough is productive of white sputum. Using Symbicort irregularly and nebulized budesonide only once daily. We discussed the importance of appropriate use of his maintenance controller medications.  03/18/12- -  79 yo M former smoker followed for asthma, sleep apnea, complicated by obesity.   PCP Dr [name] FOLLOWS FOR: still having wheezing and SOB as well(this winter has been tough-[facility] Wears BiPAP 10/3 [facility] every night for about 5-10 hours depending on sleep time [place] [facility] and would like to explore alternatives. Persistent wheeze tends to be worse in winter. Using neb in AM, Dulera just once daily due to cost. Discussed 6145-month ordering and his formulary. Not exercising and has gained weight. Cough disturbs sleep.   04/29/2012 Acute OV  Complains of head congestion with light yellow/foamy clear mucus, bilateral ear congestion, PND, wheezing, prod cough with same-colored mucus x2 weeks. Doing well on Dulera until last 2 weeks.  Wheezing is keeping him awake. Cough is worse at night.  Has sinus congestion , drainage , nasal stuffiness, sinus pressure.  Wearing BIPAP At bedtime .  No hemoptysis, fever, or edema.  Mucinex is not helping   06/05/12- 79 yo M former smoker followed for asthma, sleep apnea, complicated by obesity.   PCP Dr [name] ACUTE VISIT: saw TP 04-2012; can't shake infection off. Was given 2 rds of abx-Augmentin. Can hear him self talking-ears. Also tried sudafed. Nasal pressure/congestion.  NP visit 4/8- treated with Augmentin x2 courses, plus prednisone. This helped.  [place] Brother was diagnosed lung cancer "Sarcosatoid carcinoma".  Continues BiPAP 10/3 [facility]. CXR 03/24/12 IMPRESSION:  No evidence of acute cardiopulmonary disease.  Original Report Authenticated By: Charline BillsSriyesh Krishnan, M.D.  07/17/12- 79 yo M former smoker followed for asthma, sleep apnea, complicated by obesity.   PCP Dr [name] Follows for: Breathing has unchanged since his last visit. He states that astelin and fluticasone have helped with nasal congestion. Has occ prod cough, with minimal to moderate clear sputum. He is c/o night sweats for "quite a while now".  07/24/12-  89 yo M former smoker followed for asthma, sleep apnea, complicated by obesity.   PCP Dr [name] allergic rhinitis---here for asper/mold test Allergy Profile-total IgE 193.3 with specific elevations only for Aspergillus Skin test for Aspergillus with  Controls 07/24/12-  positive for histamine but negative for Aspergillus We are completing application for Xolair after appropriate discussion. Sputum is being sent for fungal, routine and AFB smear and culture and Quant TB assay is ordered.  09/30/12- 79 yo M former smoker followed for asthma, sleep apnea, complicated by obesity.   PCP Dr [name] allergic rhinitis- FOLLOWS FOR: Symptoms unchanged since last OV. Wearing BIPAP 10/3/ [facility], 8 hours per night.  Would like to discuss getting supplies  Cough is productive white/yellow. Grew atypical- discussed.  Dulera ok. Nebulizer little help. Pending echocardiogram. Metformin now for borderline DM. We talked about Xolair again but it is too expensive.  12/30/12- 79 yo M former smoker followed for asthma, allergic rhinitis-  Cx Tsukamurella sp similar to MAICsleep apnea, complicated by obesity.   PCP Dr [name]  FOLLOWS FOR: has bouts of coughing spells; good and bad days overall. Has not used nebulizers in about 3 months or so; could not tell they were helping. BIPAP 10/3/ [facility] Gained weight on vacation. Activity more limited now by arthritis in knees. Still paroxysmal wheeze and cough without change in pattern. Cough can be productive with Mucinex but sputum is usually white. Failed Spiriva. Xolair was too expensive. Elwin SleightDulera is better than nothing.  04/01/13- 89 yo M former smoker followed for asthma, allergic rhinitis-  Cx Tsukamurella sp( [facility] to [facility]),  sleep apnea, complicated by obesity.   PCP Dr [name]  FOLLOWS FOR:  Still having some cough, wheezing, sob with exertion BIPAP 10/3/ [facility] seemed to cause very bad cough. He [facility] with [facility] but always has some wheeze and cough. At times his chest is almost clear. He found nebulizer and Singulair little help.  ROS-see HPI Constitutional:   No-   weight loss, night sweats, fevers, chills, +fatigue, lassitude. HEENT:   No-  headaches, difficulty swallowing, tooth/dental problems, sore throat,        No-  sneezing, itching, ear ache, nasal congestion, post nasal drip,  CV:  No-   chest pain, orthopnea, PND, swelling in lower extremities, anasarca, dizziness, palpitations Resp: + shortness of breath with exertion or at rest.              + productive cough,  No non-productive cough,  No- coughing up of blood.              No-   change in color of mucus.  + wheezing.   Skin: No-   rash or lesions. GI:  No  heartburn, indigestion, abdominal pain, nausea, vomiting,  GU: . MS:  No-   joint pain or swelling.  . Neuro-     nothing unusual Psych:  No- change in mood or affect. No depression or anxiety.  No memory loss.   Objective:  OBJ- Physical Exam General- Alert, Oriented, Affect-appropriate, Distress- none acute, +morbid obesity Skin- rash-none, lesions- none, excoriation- none Lymphadenopathy- none Head- atraumatic            Eyes- Gross vision intact, PERRLA, conjunctivae and secretions clear            Ears- Hearing, canals-normal            Nose- Clear, no-Septal dev, mucus, polyps, erosion, perforation             Throat- Mallampati II , mucosa clear , drainage- none, tonsils- atrophic Neck- flexible , trachea midline, no stridor , thyroid nl, carotid no bruit Chest - symmetrical excursion , unlabored           Heart/CV- RRR , no murmur , no gallop  , no rub, nl s1 s2                           - JVD- none , edema- none, stasis changes- none, varices- none           Lung- +raspy on forced exertion, wheeze-+mild chronic, unlabored, dullness-none, rub- none           Chest wall-  Abd-  Br/ Gen/ Rectal- Not done, not indicated Extrem- cyanosis- none, clubbing, none, atrophy- none, strength- nl Neuro- grossly intact to observation

## 2013-04-02 ENCOUNTER — Encounter: Payer: Medicare Other | Admitting: Physical Therapy

## 2013-04-06 ENCOUNTER — Ambulatory Visit: Payer: Medicare Other | Admitting: Physical Therapy

## 2013-04-08 ENCOUNTER — Encounter: Payer: Medicare Other | Admitting: Physical Therapy

## 2013-04-13 ENCOUNTER — Encounter: Payer: Medicare Other | Admitting: Physical Therapy

## 2013-04-14 ENCOUNTER — Encounter: Payer: Medicare Other | Admitting: Physical Therapy

## 2013-04-19 NOTE — Assessment & Plan Note (Signed)
He continues good compliance and control but has not been successful at losing weight

## 2013-04-19 NOTE — Assessment & Plan Note (Signed)
Chronic asthma, rarely clear. There is a reflux component which he thinks he controls

## 2013-05-12 IMAGING — CR DG CHEST 2V
2 series · 2 of 2 positions shown · non-contrast
Comparison: 02/08/2009

CLINICAL DATA: Cough, shortness of breath, asthma

CHEST - 2 VIEW

[view not recorded (1 of 2)]
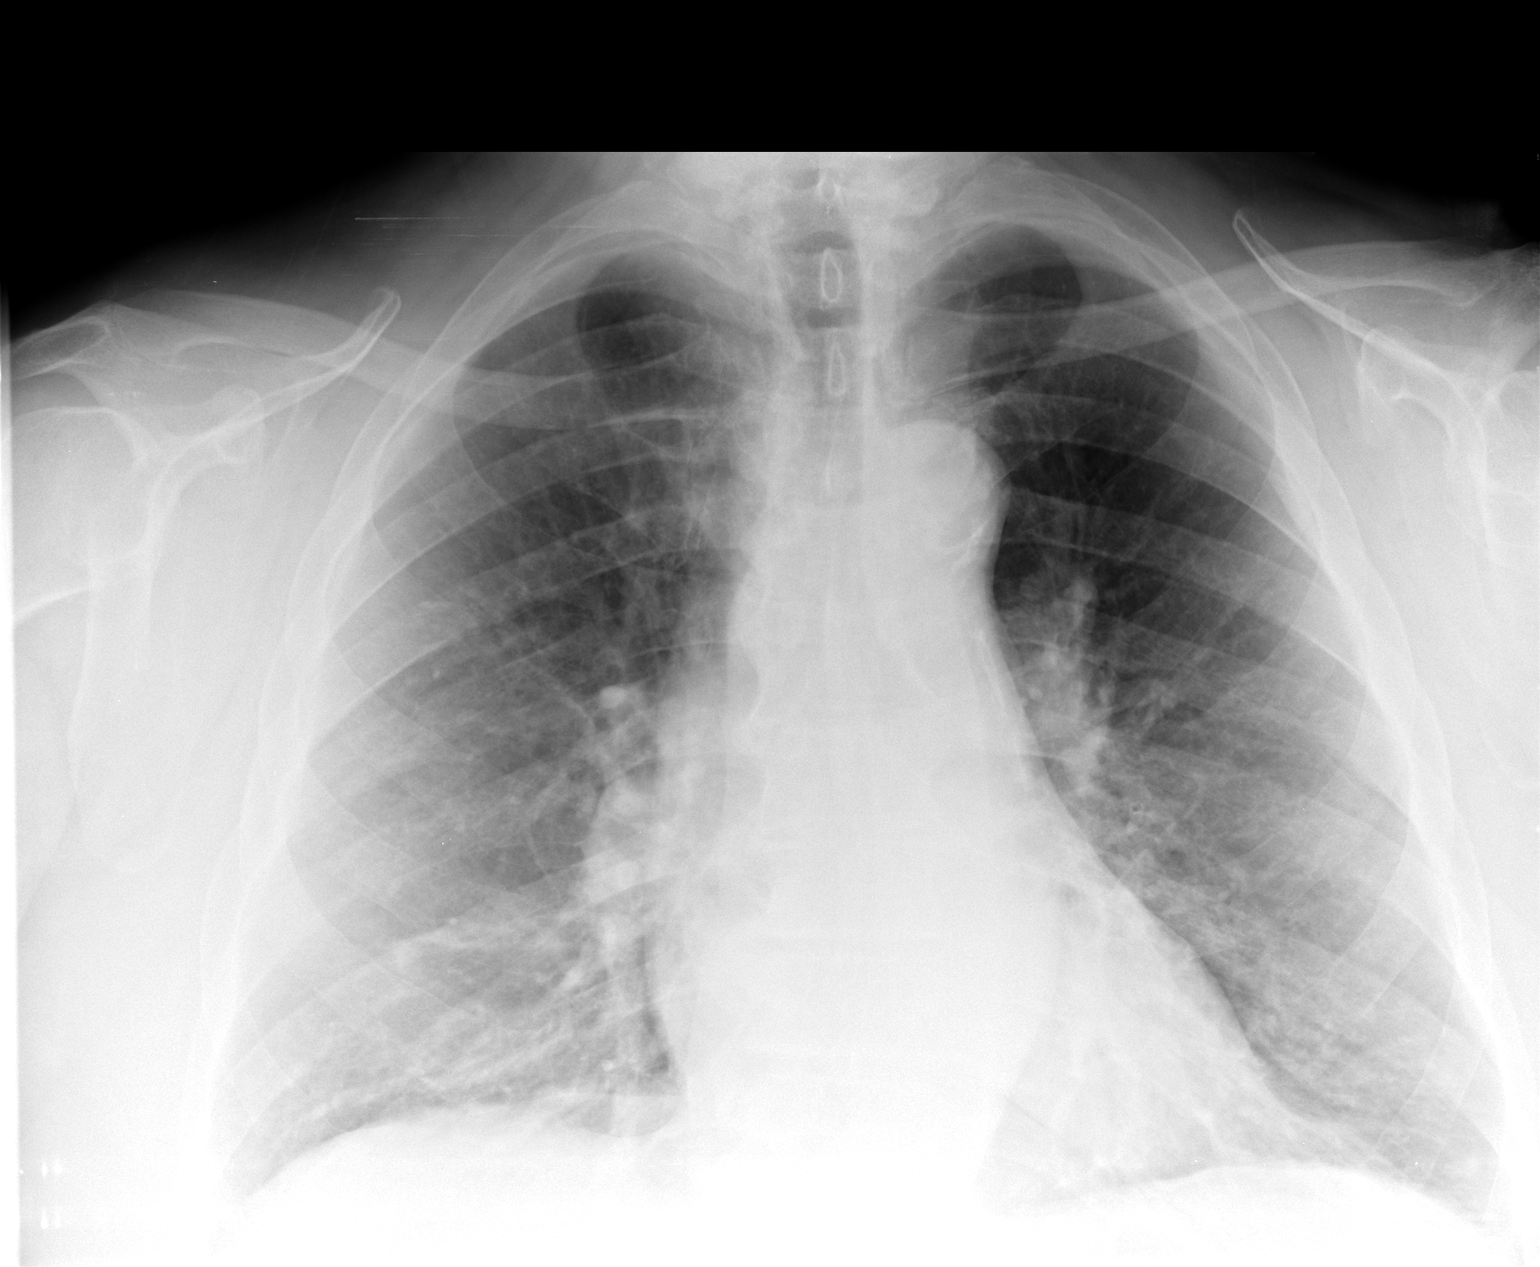

[view not recorded (2 of 2)]
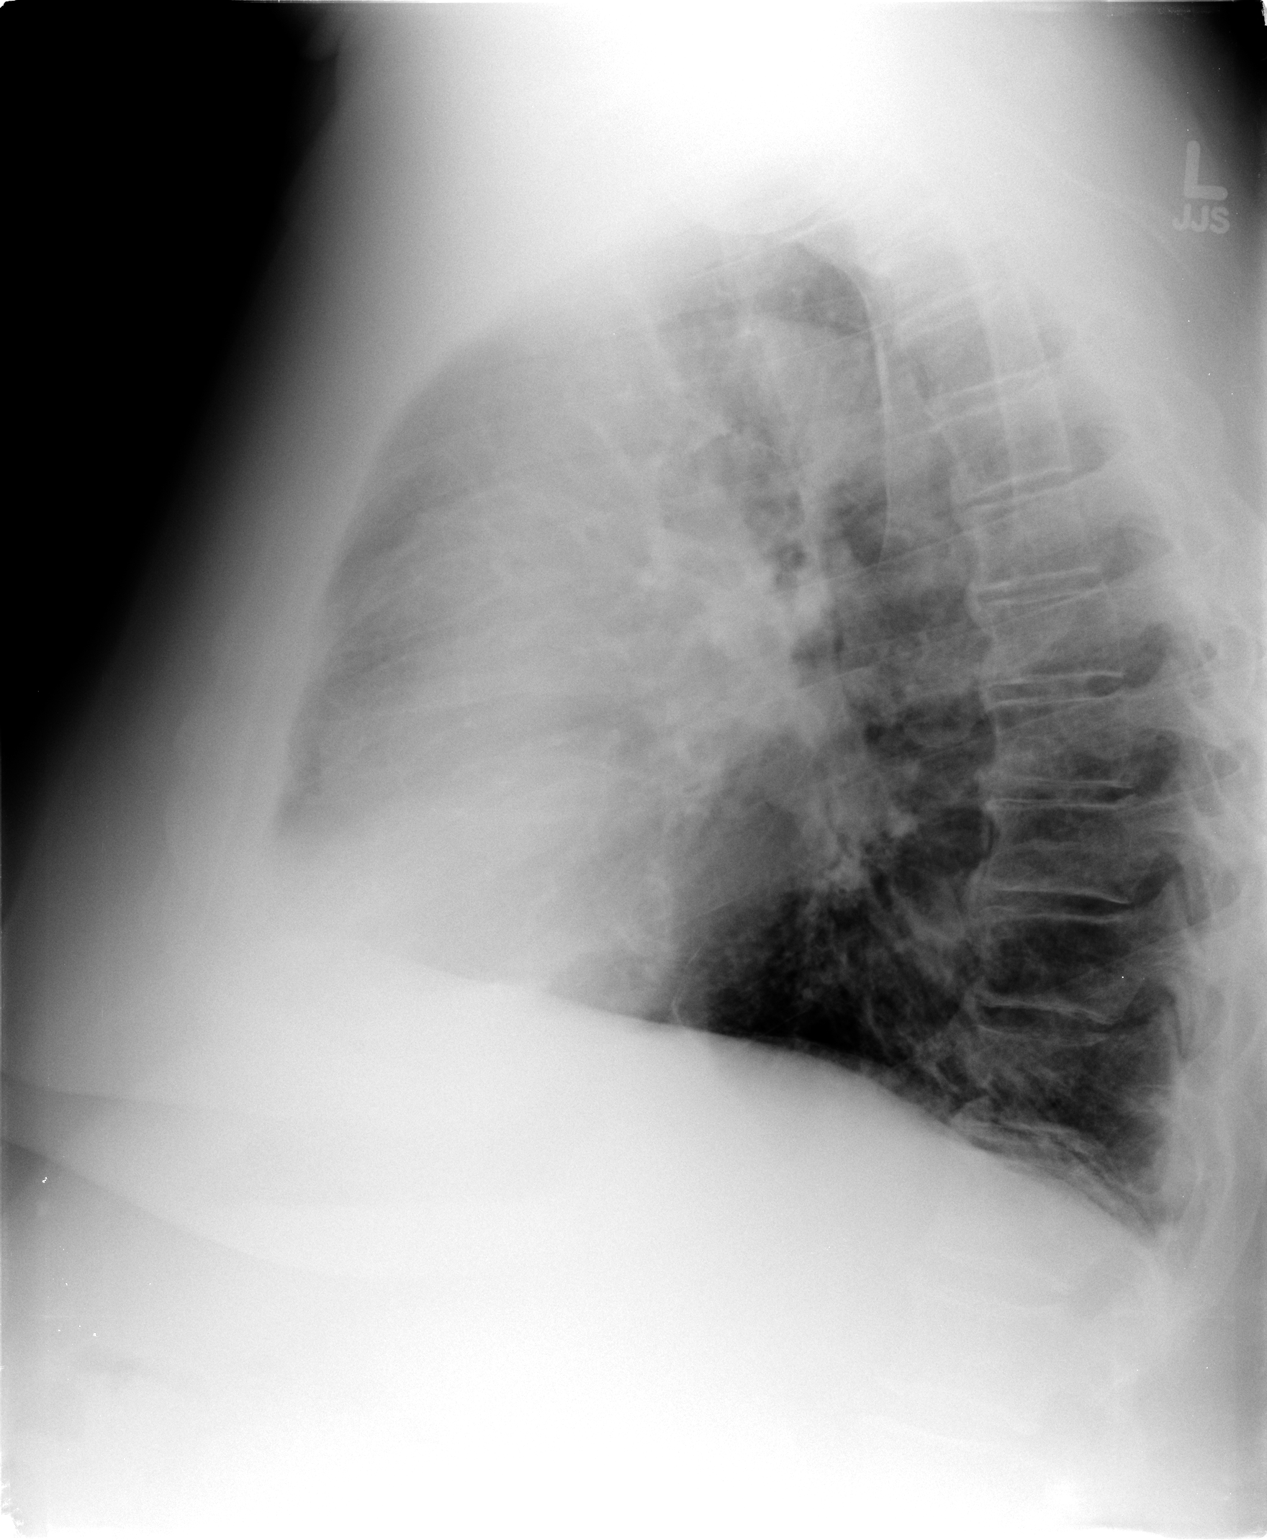

[2 of 2 positions shown; findings below may reference images not displayed]

FINDINGS: Chronic interstitial markings.  No focal consolidation.
No pleural effusion or pneumothorax.

Heart is top normal in size.

Degenerative changes of the visualized thoracolumbar spine.
IMPRESSION: No evidence of acute cardiopulmonary disease.

## 2013-08-03 ENCOUNTER — Other Ambulatory Visit: Payer: Self-pay | Admitting: Internal Medicine

## 2013-08-25 ENCOUNTER — Telehealth: Payer: Self-pay | Admitting: Internal Medicine

## 2013-08-25 MED ORDER — AMOXICILLIN-POT CLAVULANATE 875-125 MG PO TABS: 1.0000 | ORAL_TABLET | Freq: Two times a day (BID) | ORAL | Status: DC

## 2013-08-25 MED ORDER — PREDNISONE (PAK) 10 MG PO TABS: ORAL_TABLET | Freq: Every day | ORAL | Status: DC

## 2013-08-25 NOTE — Telephone Encounter (Signed)
Called and spoke with  Pt and he is aware of meds that have been sent to the pharmacy per SN. This has been done and pt is aware and nothing further is needed.

## 2013-08-25 NOTE — Telephone Encounter (Signed)
Per SN---  Call in augmentin 875 mg #14  1 po bid Prednisone taper 10 mg  6 day pack as directed Align otc 1 daily  thanks

## 2013-08-25 NOTE — Telephone Encounter (Signed)
Spoke with patient-states his asthma has flared up bad this summer and Prednisone helps take care of this. Cough-productive-clear in color, SOB and wheezing as well. Sweats and possible fever at times-has not checked temp.   Usually gets Rx for Amoxicillin and Prednisone from [facility]. Has OV scheduled with [facility] next appt.    SN please advise. Thanks

## 2013-09-30 ENCOUNTER — Encounter: Payer: Self-pay | Admitting: Internal Medicine

## 2013-09-30 ENCOUNTER — Ambulatory Visit (INDEPENDENT_AMBULATORY_CARE_PROVIDER_SITE_OTHER): Payer: Medicare Other | Admitting: Internal Medicine

## 2013-09-30 VITALS — BP 122/80 | HR 46 | Ht 69.0 in | Wt 312.8 lb

## 2013-09-30 DIAGNOSIS — Z23 Encounter for immunization: Secondary | ICD-10-CM

## 2013-09-30 DIAGNOSIS — E669 Obesity, unspecified: Secondary | ICD-10-CM

## 2013-09-30 DIAGNOSIS — R000 Tachycardia, unspecified: Secondary | ICD-10-CM

## 2013-09-30 DIAGNOSIS — J45909 Unspecified asthma, uncomplicated: Secondary | ICD-10-CM

## 2013-09-30 DIAGNOSIS — R001 Bradycardia, unspecified: Secondary | ICD-10-CM

## 2013-09-30 DIAGNOSIS — I498 Other specified cardiac arrhythmias: Secondary | ICD-10-CM

## 2013-09-30 DIAGNOSIS — G4733 Obstructive sleep apnea (adult) (pediatric): Secondary | ICD-10-CM

## 2013-09-30 DIAGNOSIS — J4540 Moderate persistent asthma, uncomplicated: Secondary | ICD-10-CM

## 2013-09-30 MED ORDER — MOMETASONE FURO-FORMOTEROL FUM 200-5 MCG/ACT IN AERO: 2.0000 | INHALATION_SPRAY | Freq: Two times a day (BID) | RESPIRATORY_TRACT | Status: DC

## 2013-09-30 MED ORDER — PREDNISONE 10 MG PO TABS: ORAL_TABLET | ORAL | Status: DC

## 2013-09-30 MED ORDER — AMOXICILLIN-POT CLAVULANATE 875-125 MG PO TABS: 1.0000 | ORAL_TABLET | Freq: Two times a day (BID) | ORAL | Status: DC

## 2013-09-30 NOTE — Assessment & Plan Note (Signed)
Good compliance and control. Weight loss would help.  

## 2013-09-30 NOTE — Assessment & Plan Note (Addendum)
Moderate to severe persistent asthma  Allergy Profile-total IgE 193.3 with specific elevations only for Aspergillus  Skin test for Aspergillus with Controls 07/24/12- positive for histamine but negative for Aspergillus  He is rejecting Xolair-too expensive  Chronic pattern. Ok to hold scripts for prednisone and augmentin as he requests, with discussion.

## 2013-09-30 NOTE — Progress Notes (Signed)
Patient ID: [name] [name], male    DOB: [birth year], 79 y.o.   MRN: [id number]  HPI 05/29/10-79 yo M former smoker followed for asthma, sleep apnea, complicated by obesity.  Last here November 28, 2009 with no problems over the winter. 3 weeks ago with sustained time working out in yard he developed by bad sustained cough with wheeze and some phlegm. Current meds gradually controlled. Refers to going to gym, but says with any sustained walking- longer parking lot etc, he has to stop repeatedly to catch his breath. Asks [facility] parking. Today he admits only very occasional cough with trace yellow mucus. Takes zyrtec with good control. He continues compliant with BiPAP, new machine, still on 10/3. He can sleep much better with this one. Still occasional nap in chair.   12/05/10- 79 yo M former smoker followed for asthma, sleep apnea, complicated by obesity.  He continues to feel raspiness in his upper airways. His primary physician gave Singulair which he has taken for 3 months, without evident effect. Using his rescue inhaler about one time per week, off and just before he goes to exercise at the [facility]. He continues Symbicort, 2 puffs once daily. Cost is a problem. Triggers for him include temperature change and brisk walking but he denies any awareness of reflux. He continues using his BiPAP machine 10/3/[facility], all night every night with good control.  04/24/11-  79 yo M former smoker followed for asthma, sleep apnea, complicated by obesity.  Increased shortness of breath/asthma over the past month with gradual progression. Best peak flow was 440 but today she only got 230. After medication she reached 320. Raspy cough and tussive back pain but little sputum. Denies fever. Was using nebulizer albuterol and budesonide. Now [employer] insurance does not want to cover nebulized budesonide. Singulair was no help.  06/05/11- 79 yo M former smoker followed for asthma, sleep apnea, complicated by  obesity.   PCP Dr [name] Wears BiPAP every night for approx 6-8 hours at night; Using nebulizers in morning, using Symbicort at noon and at night. 05-02-11 still had swelling in feet and legs, 05-16-11 began" jucing"-asthma and things were getting better; peak flow was around 450. 05-18-11 was able to "belly laugh" without coughing (first time in years) 05-25-11-was helping with yard work,sat down on deck, later that night had fever and chills(felt almost out of it-almost called 911 due to feeling so bad). Resolved with gradual clearing of sweats over next week on his own. Singulair with no help. "Loves" BiPap.  08/15/11- 79 yo M former smoker followed for asthma, sleep apnea, complicated by obesity.   PCP Dr [name] Pt on Bipap.  uses approx 8 hours 7 days weeks.pt states has some wheezing  cough,. has some gout issues   Resolved acute asthmatic bronchitis at last visit. Since then and long-term, as tended to wheeze, on most days. [name] helped but was too expensive. Medications reviewed. Complains today of gout in right foot-not calf pain.   11/13/11-77 yo M former smoker followed for asthma, sleep apnea, complicated by obesity.   PCP Dr [name] Wears bipap 10/3 [employer],wears avg. 8 hrs.,fitting good,sob increased x  2-3 wks. ,wheezing,no cp or tightness,sweats We called in prednisone in September for acute exacerbation of asthma that helped. Wheeze and cough are gradually coming back. Cough is productive of white sputum. Using Symbicort irregularly and nebulized budesonide only once daily. We discussed the importance of appropriate use of his maintenance controller medications.  03/18/12- -  79 yo M former smoker followed for asthma, sleep apnea, complicated by obesity.   PCP Dr [name] FOLLOWS FOR: still having wheezing and SOB as well(this winter has been tough-not going to the [facility]). Wears BiPAP 10/3 [employer] every night for about 5-10 hours depending on sleep time Has concerns about billing  practices at [place] and would like to explore alternatives. Persistent wheeze tends to be worse in winter. Using neb in AM, Dulera just once daily due to cost. Discussed 22-month ordering and his formulary. Not exercising and has gained weight. Cough disturbs sleep.   04/29/2012 Acute OV  Complains of head congestion with light yellow/foamy clear mucus, bilateral ear congestion, PND, wheezing, prod cough with same-colored mucus x2 weeks. Doing well on Dulera until last 2 weeks.  Wheezing is keeping him awake. Cough is worse at night.  Has sinus congestion , drainage , nasal stuffiness, sinus pressure.  Wearing BIPAP At bedtime .  No hemoptysis, fever, or edema.  Mucinex is not helping   06/05/12- 30 yo M former smoker followed for asthma, sleep apnea, complicated by obesity.   PCP Dr [name] ACUTE VISIT: saw TP 04-2012; can't shake infection off. Was given 2 rds of abx-Augmentin. Can hear him self talking-ears. Also tried sudafed. Nasal pressure/congestion.  NP visit 4/8- treated with Augmentin x2 courses, plus prednisone. This helped.  He visited [place]. Brother was diagnosed lung cancer "Sarcosatoid carcinoma".  Continues BiPAP 10/3 [facility]. CXR 03/24/12 IMPRESSION:  No evidence of acute cardiopulmonary disease.  Original Report Authenticated By: Charline Bills, M.D.  07/17/12- 30 yo M former smoker followed for asthma, sleep apnea, complicated by obesity.   PCP Dr Jillene Bucks for: Breathing has unchanged since his last visit. He states that astelin and fluticasone have helped with nasal congestion. Has occ prod cough, with minimal to moderate clear sputum. He is c/o night sweats for "quite a while now".  07/24/12-  31 yo M former smoker followed for asthma, sleep apnea, complicated by obesity.   PCP Dr [name] allergic rhinitis---here for asper/mold test Allergy Profile-total IgE 193.3 with specific elevations only for Aspergillus Skin test for Aspergillus with  Controls 07/24/12-  positive for histamine but negative for Aspergillus We are completing application for Xolair after appropriate discussion. Sputum is being sent for fungal, routine and AFB smear and culture and Quant TB assay is ordered.  09/30/12- 82 yo M former smoker followed for asthma, sleep apnea, complicated by obesity.   PCP Dr [name] allergic rhinitis- FOLLOWS FOR: Symptoms unchanged since last OV. Wearing BIPAP 10/3/ [facility], 8 hours per night.  Would like to discuss getting supplies  Cough is productive white/yellow. Grew atypical- discussed.  Dulera ok. Nebulizer little help. Pending echocardiogram. Metformin now for borderline DM. We talked about Xolair again but it is too expensive.  12/30/12- 77 yo M former smoker followed for asthma, allergic rhinitis-  Cx Tsukamurella sp similar to MAICsleep apnea, complicated by obesity.   PCP Dr [name]  FOLLOWS FOR: has bouts of coughing spells; good and bad days overall. Has not used nebulizers in about 3 months or so; could not tell they were helping. BIPAP 10/3/ [facility] Gained weight on vacation. Activity more limited now by arthritis in knees. Still paroxysmal wheeze and cough without change in pattern. Cough can be productive with Mucinex but sputum is usually white. Failed Spiriva. Xolair was too expensive. Elwin Sleight is better than nothing.  04/01/13- 5 yo M former smoker followed for asthma, allergic rhinitis-  Cx Tsukamurella sp( similar to [facility]),  sleep apnea, complicated by obesity.   PCP Dr [name]  FOLLOWS FOR:  Still having some cough, wheezing, sob with exertion BIPAP 10/3/ [facility] seemed to cause very bad cough. He does better with [facility] but always has some wheeze and cough. At times his chest is almost clear. He found nebulizer and Singulair little help.  09/30/13- 70 yo M former smoker followed for asthma, allergic rhinitis, OSA,   Hx sputum Cx Tsukamurella sp ( similar to [facility]),  complicated by obesity.   PCP Dr [name]  FOLLOWS FOR:  Feels better now-would like to have Pred taper and Augmentin Rx to hold in case another asthma flare up as it did 08-25-13. Nurse noted slow/ irregular pulse on arrival-EKG 09/30/13- 1degAVB, sinus at 67/min, PACs Resolved exacerbation after last visit. Usually some wheeze. Needs prednisone about every 3-4 months- most recently early August. Uses Dulera. Compliant and successful w BIPAP 10/3 [employer], prevents daytime sleepiness.  ROS-see HPI Constitutional:   No-   weight loss, night sweats, fevers, chills, +fatigue, lassitude. HEENT:   No-  headaches, difficulty swallowing, tooth/dental problems, sore throat,       No-  sneezing, itching, ear ache, nasal congestion, post nasal drip,  CV:  No-   chest pain, orthopnea, PND, swelling in lower extremities, anasarca, dizziness, palpitations Resp: + shortness of breath with exertion or at rest.              + productive cough,  + non-productive cough,  No- coughing up of blood.              No-   change in color of mucus.  + wheezing.   Skin: No-   rash or lesions. GI:  No  heartburn, indigestion, abdominal pain, nausea, vomiting,  GU: . MS:  No-   joint pain or swelling.  . Neuro-     nothing unusual Psych:  No- change in mood or affect. No depression or anxiety.  No memory loss.   Objective:  OBJ- Physical Exam General- Alert, Oriented, Affect-appropriate, Distress- none acute, +morbid obesity Skin- rash-none, lesions- none, excoriation- none Lymphadenopathy- none Head- atraumatic            Eyes- Gross vision intact, PERRLA, conjunctivae and secretions clear            Ears- Hearing, canals-normal            Nose- Clear, no-Septal dev, mucus, polyps, erosion, perforation             Throat- Mallampati II , mucosa clear , drainage- none, tonsils- atrophic Neck- flexible , trachea midline, no stridor , thyroid nl, carotid no bruit Chest - symmetrical excursion , unlabored           Heart/CV- +slow pulse increased w exertion getting on exam  table, occ early beat , no                         murmur, no gallop  , no rub, nl s1 s2                           - JVD- none , edema- none, stasis changes- none, varices- none           Lung- +raspy on forced exertion, wheeze-+mild chronic, unlabored, dullness-none,  rub- none           Chest wall-  Abd-  Br/ Gen/ Rectal- Not done, not indicated Extrem- cyanosis- none, clubbing, none, atrophy- none, strength- nl Neuro- grossly intact to observation

## 2013-09-30 NOTE — Patient Instructions (Addendum)
Flu vax  Dulera refill sent to [facility]  Scripts sent for prednisone taper and augmentin to hold  Copy of your EKG for you to show to Dr [name]- your heart rate here on arrival was 46, which may be slow because of the atenolol beta blocker  Ask Dr [name] if you have had the Prevnar-13 pneumonia vaccine for our records

## 2013-09-30 NOTE — Assessment & Plan Note (Signed)
Offered counseling Bariatric program

## 2013-09-30 NOTE — Assessment & Plan Note (Signed)
Slow pulse likely due to atenolol. He will see his PCP soon and will take copy of EKG for his record. May not need any change.

## 2014-02-01 ENCOUNTER — Telehealth: Payer: Self-pay | Admitting: Internal Medicine

## 2014-02-01 MED ORDER — MOMETASONE FURO-FORMOTEROL FUM 200-5 MCG/ACT IN AERO: 2.0000 | INHALATION_SPRAY | Freq: Two times a day (BID) | RESPIRATORY_TRACT | Status: DC

## 2014-02-01 NOTE — Telephone Encounter (Signed)
Pt states that [employer] was supposed to send a tier [employer]ception form to Dr Maple HudsonYoung to fill out. States he gave them out office number and fax number and they were faxing this at the end of last month. Pt is calling to check on status. Katie please advise if you have seen this form and if Dr Maple HudsonYoung possibly has it to sign. Sample put up front for pt to pick up while he waits for Tier Exception. Pt states that Dulera 200mcg is Tier 4 and is $240 for 90 day supply. Pt states that at a Tier 3, this will cost him $120 for 90-day supply.  If no form has been received we will need to contact his [employer] insurance to gather this information.   Please advise Katie. Thanks.

## 2014-02-04 NOTE — Telephone Encounter (Signed)
Katie what is the status of this? Thanks. 

## 2014-02-05 NOTE — Telephone Encounter (Signed)
Called [employer] at [phone] PT ID #  [id number] Waiting on fax from [employer] to fax over the tier exception form.   Will hold in triage until this form is received.

## 2014-02-05 NOTE — Telephone Encounter (Signed)
Fax received and placed on CDY cart. Will forward to Katie to f/u on

## 2014-02-05 NOTE — Telephone Encounter (Signed)
I have not seen any forms on this patient as of today. Sorry.

## 2014-02-09 NOTE — Telephone Encounter (Signed)
Have these forms been taken care of? 

## 2014-02-11 NOTE — Telephone Encounter (Signed)
CY gave me forms today with signature- I have faxed forms back and will await a decision.

## 2014-02-15 NOTE — Telephone Encounter (Signed)
Spoke with pt to make him aware- pt states he's tried "all the medications" listed symbicort and spiriva- states Dr. Maple HudsonYoung has tried him on several samples.  I advised pt that insurances can sometimes require you to try and fail particular medications before they cover others- he will bring in his formulary for Dr. Maple HudsonYoung to see if there are any other medications he must try and fail before they will cover dulera.   Forwarding to Katie to follow up on.

## 2014-02-15 NOTE — Telephone Encounter (Signed)
Called [employer]-denied tier exception as patient has not tried and failed lower tier formulary meds([employer] did not have name of meds to try and fail first). Pt will need to look at his formulary list and let us know what is on there for lower co-pay.

## 2014-02-18 MED ORDER — MOMETASONE FURO-FORMOTEROL FUM 200-5 MCG/ACT IN AERO: 2.0000 | INHALATION_SPRAY | Freq: Two times a day (BID) | RESPIRATORY_TRACT | Status: DC

## 2014-02-18 NOTE — Telephone Encounter (Signed)
Katie can you give an update on this? Thanks. 

## 2014-02-18 NOTE — Telephone Encounter (Signed)
I have not heard from patient as to when he was to bring the formulary list to us to look at. Please contact patient and let me know. Thanks.

## 2014-02-18 NOTE — Telephone Encounter (Signed)
Spoke with pt and he states that he is waiting for [employer]  To fax him a copy of formulary.  When he received this he is going to fax a copy to our office attn Katie/ Dr Maple HudsonYoung.  He has been using the sample we gave him sparingly but has recently had some increased trouble breathing.  Advised that we will leave him another sample to use until we hear from insurance company.  Will [facility] to [facility] to await forms.

## 2014-02-23 NOTE — Telephone Encounter (Signed)
Pt called back stating that he spoke with his insurance company about having a formulary faxed. Pt states that he was advised again that they do not have a formulary. I explained to the patient that every insurance company has a formulary list of all medications covered. Pt states that he spent a whole phone call arguing with [employer] about this medication formulary and still got nothing accomplished. Pt states that he is at a loss and is very frustrated. I apologized for the frustration that this is causing to the patient and offered to contact his insurance for him. Pt states that he was advised that he must have tried/failed at least 2 of the preferred medications in the formulary in order for the [facility] to be approved.  Pt BCBS ID: [id number] Phone # [phone]  Called and spoke with Rep at [facility], was walked through how to look up medication formulary on insurance web page.  Portion of formulary pertaining to "Repiratory" printed and placed on CY desk to look at and pick alternative inhaler from.   Pt is aware that we now have the correct information and will call him once Dr Maple HudsonYoung reviews.

## 2014-02-23 NOTE — Telephone Encounter (Signed)
His formulary will cover Advair, Symbicort or Breo at Tier 3, instead of Dulera. Does he have a preference based on what he has used before. If he wants Elwin SleightDulera he will have to pay the higher price for Tier 4.

## 2014-02-23 NOTE — Telephone Encounter (Signed)
Were the forms received?

## 2014-02-23 NOTE — Telephone Encounter (Signed)
Spoke with patient, pt states that he has yet to receive anything from [employer] (Rx formulary)  Pt states that he keeps requesting this be faxed to his personal fax machine. States that he keeps receiving letters stating "notice of denial of medicare Rx drug coverage", no formulary received yet. Pt states that he will call and request this again from insurance company. Will keep us up to date.   Will send this back to Katie to follow up on - pt needs to be called for update in a few days if not heard back.

## 2014-02-23 NOTE — Telephone Encounter (Signed)
I have not received any forms on this patient-please find out if and when patient sent forms. Thanks.

## 2014-02-24 NOTE — Telephone Encounter (Signed)
Pt is aware of recs from CY-pt states he has tried those inhalers in the past -not effective and requests we attempt to get [employer] to do tier exception. Pt aware I will call [employer] and update him accordingly.

## 2014-03-01 NOTE — Telephone Encounter (Signed)
What's the status of this message? Thanks.

## 2014-03-08 ENCOUNTER — Telehealth: Payer: Self-pay | Admitting: Internal Medicine

## 2014-03-08 MED ORDER — ANTIPYRINE-BENZOCAINE 5.4-1.4 % OT SOLN: OTIC | Status: DC

## 2014-03-08 NOTE — Telephone Encounter (Signed)
Offer Rx Auralgan (A-B Otic) ear drops- 1 or 2 drops in affected ear, then cotton ball, 2-3 times daily. If no better in a couple of days, may need antibiotic

## 2014-03-08 NOTE — Telephone Encounter (Signed)
Called and spoke with pt and he is aware of CY recs for the ear drops.  This has been sent to his pharmacy and nothing further is needed.

## 2014-03-08 NOTE — Telephone Encounter (Signed)
Pt c/o right ear pain, pain radiates into right jaw joint and below ear is very tender to the touch.  Pt denies tinnitus or any discharge. Reports that the right jaw and throat hurts to swallow. Pt denies sore throat.  Would like rec's of something that might be called into pharmacy or use OTC.  Can patient use Ice/Heat on area of discomfort?  [facility]  Please advise Dr Maple HudsonYoung. Thanks.  No Known Allergies    Medication List       This list is accurate as of: 03/08/14 12:53 PM.  Always use your most recent med list.               amLODipine 10 MG tablet  Commonly known as:  NORVASC  Take 10 mg by mouth daily.     amoxicillin-clavulanate 875-125 MG per tablet  Commonly known as:  AUGMENTIN  Take 1 tablet by mouth 2 (two) times daily.     aspirin 81 MG tablet  Take 81 mg by mouth daily.     atenolol 25 MG tablet  Commonly known as:  TENORMIN  Take 25 mg by mouth Daily.     Cetirizine HCl 10 MG Caps  Take 1 capsule by mouth at bedtime.     Fish Oil 1000 MG Caps  Take by mouth. 1 in the morning and 1 at night     fluticasone 50 MCG/ACT nasal spray  Commonly known as:  FLONASE  USE 2 SPRAYS IN EACH NOSTRIL AT BEDTIME     furosemide 20 MG tablet  Commonly known as:  LASIX  Take 20 mg by mouth daily.     losartan 100 MG tablet  Commonly known as:  COZAAR  Take 100 mg by mouth daily.     metFORMIN 500 MG 24 hr tablet  Commonly known as:  GLUCOPHAGE-XR  Take 1 tablet by mouth daily.     mometasone-formoterol 200-5 MCG/ACT Aero  Commonly known as:  DULERA  Inhale 2 puffs into the lungs 2 (two) times daily.     MUCUS RELIEF D PO  Take by mouth. 1 in the morning and 1 at noon     pravastatin 80 MG tablet  Commonly known as:  PRAVACHOL  80 mg Daily.     predniSONE 10 MG tablet  Commonly known as:  DELTASONE  4 X 2 DAYS, 3 X 2 DAYS, 2 X 2 DAYS, 1 X 2 DAYS     PROAIR HFA 108 (90 BASE) MCG/ACT inhaler  Generic drug:  albuterol  Inhale 2 puffs into the  lungs every 6 (six) hours as needed.     STOOL SOFTENER PO  Take 2 capsules by mouth at bedtime.

## 2014-03-09 ENCOUNTER — Telehealth: Payer: Self-pay | Admitting: Internal Medicine

## 2014-03-09 MED ORDER — AMOXICILLIN 500 MG PO CAPS: 500.0000 mg | ORAL_CAPSULE | Freq: Three times a day (TID) | ORAL | Status: DC

## 2014-03-09 NOTE — Telephone Encounter (Signed)
Drug store says A-B Otic ear drops no longer available. Pt request antibiotic for bilateral earache. Plan- amoxacillin 500 mg 3xd, # 21,

## 2014-03-09 NOTE — Telephone Encounter (Signed)
Katie please advise if you have spoken with patient insurance regarding medication. Please advise on status. Thanks.

## 2014-03-09 NOTE — Telephone Encounter (Signed)
Spoke with the pt and notified of recs per CDY  He verbalized understanding  Have already picked up rx  Nothing further needed

## 2014-03-16 MED ORDER — MOMETASONE FURO-FORMOTEROL FUM 200-5 MCG/ACT IN AERO: 2.0000 | INHALATION_SPRAY | Freq: Two times a day (BID) | RESPIRATORY_TRACT | Status: DC

## 2014-03-16 NOTE — Telephone Encounter (Signed)
Called and spoke with pt and he is aware of CY recs.  Pt requested that 1 sample be given to him since he can only get 3 at a time and the cost is too much.  He has an upcoming appt with CY in march and he will discuss at that time with CY about changing to an alternative medication.  Pt is aware that sample is up front and he will be by tomorrow to pick this up.  Nothing further is needed

## 2014-03-16 NOTE — Telephone Encounter (Signed)
Spoke with [employer] rep-the only option(s) we and patient have is to wait for 60 days and then call back to try Tier exception again for [facility] or go through the appeals process by calling [phone]. If we wait the 60 days then we can update [employer] of the inhalers patient states he has tried and failed(listed below). 60 Days will be up on March 21,2016.    Pt states he has been wining himself off of Dulera and using Essential oils only-states he feels good at the present time and would like to know if CY is willing to let him continue this and stay off of Dulera.    CY Please advise. Thanks.

## 2014-03-16 NOTE — Telephone Encounter (Signed)
He has always wheezed despite our medicines. If he does as well on his aromatherapy approach, I will be interested to talk with him about it at next visit. Ok to wean off Dulera, but keep some on hand in reserve.

## 2014-03-16 NOTE — Telephone Encounter (Signed)
Katie please advise on the status of this.  Thank you.

## 2014-03-18 ENCOUNTER — Telehealth: Payer: Self-pay | Admitting: Internal Medicine

## 2014-03-18 DIAGNOSIS — H9201 Otalgia, right ear: Secondary | ICD-10-CM

## 2014-03-18 NOTE — Telephone Encounter (Signed)
Spoke with pt, c/o R earache, states he cannot close his mouth because of the pain.  States he is "clogged in his eustachian tube and all that stuff".  Was put on a 7 day amoxicilin rx, which helped but pain worsened since finishing abx.  Pt is unsure if he needs to move appt up with CY sooner than 3/9 or if he should be referred to an ENT. [facility].   CY please advise on recs.  Thank you.  No Known Allergies Current Outpatient Prescriptions on File Prior to Visit  Medication Sig Dispense Refill  . albuterol (PROAIR HFA) 108 (90 BASE) MCG/ACT inhaler Inhale 2 puffs into the lungs every 6 (six) hours as needed.      Marland Kitchen. amLODipine (NORVASC) 10 MG tablet Take 10 mg by mouth daily.      Marland Kitchen. amoxicillin (AMOXIL) 500 MG capsule Take 1 capsule (500 mg total) by mouth 3 (three) times daily. 21 capsule 0  . amoxicillin-clavulanate (AUGMENTIN) 875-125 MG per tablet Take 1 tablet by mouth 2 (two) times daily. 14 tablet 0  . antipyrine-benzocaine (AURALGAN) otic solution 1-2 drops in the affected ear, 2-3 times per day 10 mL 0  . aspirin 81 MG tablet Take 81 mg by mouth daily.      Marland Kitchen. atenolol (TENORMIN) 25 MG tablet Take 25 mg by mouth Daily.     . Cetirizine HCl 10 MG CAPS Take 1 capsule by mouth at bedtime.      Tery Sanfilippo. Docusate Calcium (STOOL SOFTENER PO) Take 2 capsules by mouth at bedtime.      . fluticasone (FLONASE) 50 MCG/ACT nasal spray USE 2 SPRAYS IN EACH NOSTRIL AT BEDTIME 16 g 6  . furosemide (LASIX) 20 MG tablet Take 20 mg by mouth daily.      Marland Kitchen. losartan (COZAAR) 100 MG tablet Take 100 mg by mouth daily.      . metFORMIN (GLUCOPHAGE-XR) 500 MG 24 hr tablet Take 1 tablet by mouth daily.    . mometasone-formoterol (DULERA) 200-5 MCG/ACT AERO Inhale 2 puffs into the lungs 2 (two) times daily. 1 Inhaler 0  . Omega-3 Fatty Acids (FISH OIL) 1000 MG CAPS Take by mouth. 1 in the morning and 1 at night    . pravastatin (PRAVACHOL) 80 MG tablet 80 mg Daily.    . predniSONE (DELTASONE) 10 MG  tablet 4 X 2 DAYS, 3 X 2 DAYS, 2 X 2 DAYS, 1 X 2 DAYS 20 tablet 0  . Pseudoephedrine-Guaifenesin (MUCUS RELIEF D PO) Take by mouth. 1 in the morning and 1 at noon     No current facility-administered medications on file prior to visit.

## 2014-03-18 NOTE — Telephone Encounter (Signed)
Suggest referral to ENT- [facility] Dr [facility] or [facility], for diagnosis of earache

## 2014-03-18 NOTE — Telephone Encounter (Signed)
Spoke with pt, he is aware of recs.  Referral placed.  Nothing further needed.  

## 2014-03-31 ENCOUNTER — Ambulatory Visit (INDEPENDENT_AMBULATORY_CARE_PROVIDER_SITE_OTHER): Payer: Medicare Other | Admitting: Internal Medicine

## 2014-03-31 ENCOUNTER — Encounter: Payer: Self-pay | Admitting: Internal Medicine

## 2014-03-31 VITALS — BP 148/72 | HR 52 | Ht 68.0 in | Wt 310.0 lb

## 2014-03-31 DIAGNOSIS — J4520 Mild intermittent asthma, uncomplicated: Secondary | ICD-10-CM

## 2014-03-31 DIAGNOSIS — H9203 Otalgia, bilateral: Secondary | ICD-10-CM | POA: Insufficient documentation

## 2014-03-31 DIAGNOSIS — G4733 Obstructive sleep apnea (adult) (pediatric): Secondary | ICD-10-CM

## 2014-03-31 MED ORDER — ANTIPYRINE-BENZOCAINE 5.4-1.4 % OT SOLN: OTIC | Status: DC

## 2014-03-31 MED ORDER — LEVALBUTEROL HCL 0.63 MG/3ML IN NEBU
0.6300 mg | INHALATION_SOLUTION | Freq: Once | RESPIRATORY_TRACT | Status: AC
  Administered 2014-03-31: 0.63 mg via RESPIRATORY_TRACT

## 2014-03-31 MED ORDER — ALBUTEROL SULFATE HFA 108 (90 BASE) MCG/ACT IN AERS: 2.0000 | INHALATION_SPRAY | Freq: Four times a day (QID) | RESPIRATORY_TRACT | Status: DC | PRN

## 2014-03-31 NOTE — Patient Instructions (Addendum)
Order- DME- asks to change from [facility] to continue BIPAP 10.3, mask of choice, humidifier, supplies   Dx OSA  Script for auralgan ear drops- See if another drug store has this for ear pain if needed  Neb xop 0.63  Script for Proair rescue inhaler to use as needed

## 2014-03-31 NOTE — Assessment & Plan Note (Addendum)
Great compliance and control. Remains medically necessary. He doesn't like working with [facility] and asks help changing to a different DME company.

## 2014-03-31 NOTE — Progress Notes (Signed)
Patient ID: [name] [name], male    DOB: [birth year], 80 y.o.   MRN: [id number]  HPI 05/29/10-80 yo M former smoker followed for asthma, sleep apnea, complicated by obesity.  Last here November 28, 2009 with no problems over the winter. 3 weeks ago with sustained time working out in yard he developed by bad sustained cough with wheeze and some phlegm. Current meds gradually controlled. Refers to going to gym, but says with any sustained walking- longer parking lot etc, he has to stop repeatedly to catch his breath. Asks [facility] parking. Today he admits only very occasional cough with trace yellow mucus. Takes zyrtec with good control. He continues compliant with BiPAP, new machine, still on 10/3. He can sleep much better with this one. Still occasional nap in chair.   12/05/10- 80 yo M former smoker followed for asthma, sleep apnea, complicated by obesity.  He continues to feel raspiness in his upper airways. His primary physician gave Singulair which he has taken for 3 months, without evident effect. Using his rescue inhaler about one time per week, off and just before he goes to exercise at the [facility]. He continues Symbicort, 2 puffs once daily. Cost is a problem. Triggers for him include temperature change and brisk walking but he denies any awareness of reflux. He continues using his BiPAP machine 10/3/[facility], all night every night with good control.  04/24/11-  80 yo M former smoker followed for asthma, sleep apnea, complicated by obesity.  Increased shortness of breath/asthma over the past month with gradual progression. Best peak flow was 440 but today she only got 230. After medication she reached 320. Raspy cough and tussive back pain but little sputum. Denies fever. Was using nebulizer albuterol and budesonide. Now [employer] insurance does not want to cover nebulized budesonide. Singulair was no help.  06/05/11- 80 yo M former smoker followed for asthma, sleep apnea, complicated by  obesity.   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 05-02-11 still had swelling in feet and legs, 05-16-11 began" jucing"-asthma and things were getting better; peak flow was around 450. 05-18-11 was able to "belly laugh" without coughing (first time in years) 05-25-11-was helping with yard work,sat down on deck, later that night had fever and chills(felt almost out of it-almost called 911 due to feeling so bad). Resolved with gradual clearing of sweats over next week on his own. Singulair with no help. "Loves" BiPap.  08/15/11- 80 yo M former smoker followed for asthma, sleep apnea, complicated by obesity.   PCP Dr [name] [name] Pt on Bipap.  uses approx 8 hours 7 days weeks.pt states has some wheezing  cough,. has some gout issues   Resolved acute asthmatic bronchitis at last visit. Since then and long-term, as tended to wheeze, on most days. [name] [name] helped but was too expensive. Medications reviewed. Complains today of gout in right foot-not calf pain.   11/13/11-77 yo M former smoker followed for asthma, sleep apnea, complicated by obesity.   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 8 hrs.,fitting good,sob increased x  2-3 wks. ,wheezing,no cp or tightness,sweats We called in prednisone in September for acute exacerbation of asthma that helped. Wheeze and cough are gradually coming back. Cough is productive of white sputum. Using Symbicort irregularly and nebulized budesonide only once daily. We discussed the importance of appropriate use of his maintenance controller medications.  03/18/12- -  80 yo M former smoker followed for asthma, sleep apnea, complicated by obesity.   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 Wears BiPAP 10/3 [facility] every night for about 5-10 hours depending on sleep time Has concerns about billing  practices at [facility] and would like to explore alternatives. Persistent wheeze tends to be worse in winter. Using neb in AM, Dulera just once daily due to cost. Discussed 7172-month ordering and his formulary. Not exercising and has gained weight. Cough disturbs sleep.   04/29/2012 Acute OV  Complains of head congestion with light yellow/foamy clear mucus, bilateral ear congestion, PND, wheezing, prod cough with same-colored mucus x2 weeks. Doing well on Dulera until last 2 weeks.  Wheezing is keeping him awake. Cough is worse at night.  Has sinus congestion , drainage , nasal stuffiness, sinus pressure.  Wearing BIPAP At bedtime .  No hemoptysis, fever, or edema.  Mucinex is not helping   06/05/12- 80 yo M former smoker followed for asthma, sleep apnea, complicated by obesity.   PCP Dr [name] [name] ACUTE VISIT: saw TP 04-2012; can't shake infection off. Was given 2 rds of abx-Augmentin. Can hear him self talking-ears. Also tried sudafed. Nasal pressure/congestion.  NP visit 4/8- treated with Augmentin x2 courses, plus prednisone. This helped.  [place] Brother was diagnosed lung cancer "Sarcosatoid carcinoma".  Continues BiPAP 10/3 [facility]. CXR 03/24/12 IMPRESSION:  No evidence of acute cardiopulmonary disease.  Original Report Authenticated By: Charline BillsSriyesh [name], M.D.  07/17/12- 80 yo M former smoker followed for asthma, sleep apnea, complicated by obesity.   PCP Dr [name] [name] Follows for: Breathing has unchanged since his last visit. He states that astelin and fluticasone have helped with nasal congestion. Has occ prod cough, with minimal to moderate clear sputum. He is c/o night sweats for "quite a while now".  07/24/12-  80 yo M former smoker followed for asthma, sleep apnea, complicated by obesity.   PCP Dr [name] [name] allergic rhinitis---here for asper/mold test Allergy Profile-total IgE 193.3 with specific elevations only for Aspergillus Skin test for Aspergillus with  Controls 07/24/12-  positive for histamine but negative for Aspergillus We are completing application for Xolair after appropriate discussion. Sputum is being sent for fungal, routine and AFB smear and culture and Quant TB assay is ordered.  09/30/12- 80 yo M former smoker followed for asthma, sleep apnea, complicated by obesity.   PCP Dr [name] [name] allergic rhinitis- FOLLOWS FOR: Symptoms unchanged since last OV. Wearing BIPAP 10/3/ [facility], 8 hours per night.  Would like to discuss getting supplies  Cough is productive white/yellow. Grew atypical- discussed.  Dulera ok. Nebulizer little help. Pending echocardiogram. Metformin now for borderline DM. We talked about Xolair again but it is too expensive.  12/30/12- 80 yo M former smoker followed for asthma, allergic rhinitis-  Cx Tsukamurella sp similar to [facility] apnea, complicated by obesity.   PCP Dr [name] [name]  FOLLOWS FOR: has bouts of coughing spells; good and bad days overall. Has not used nebulizers in about 3 months or so; could not tell they were helping. BIPAP 10/3/ [facility] Gained weight on vacation. Activity more limited now by arthritis in knees. Still paroxysmal wheeze and cough without change in pattern. Cough can be productive with Mucinex but sputum is usually white. Failed Spiriva. Xolair was too expensive. Elwin SleightDulera is better than nothing.  04/01/13- 80 yo M former smoker followed for asthma, allergic rhinitis-  Cx Tsukamurella sp( similar to [facility]),  sleep apnea, complicated by obesity.   PCP Dr [name] Glatter  FOLLOWS FOR:  Still having some cough, wheezing, sob with exertion BIPAP 10/3/ [facility] seemed to cause very bad cough. He does better with [facility] but always has some wheeze and cough. At times his chest is almost clear. He found nebulizer and Singulair little help.  09/30/13- 79 yo M former smoker followed for asthma, allergic rhinitis, OSA,   Hx sputum Cx Tsukamurella sp ( similar to [facility]),  complicated by obesity.   PCP Dr [name] Glatter  FOLLOWS FOR:  Feels better now-would like to have Pred taper and Augmentin Rx to hold in case another asthma flare up as it did 08-25-13. Nurse noted slow/ irregular pulse on arrival-EKG 09/30/13- 1degAVB, sinus at 67/min, PACs Resolved exacerbation after last visit. Usually some wheeze. Needs prednisone about every 3-4 months- most recently early August. Uses Dulera. Compliant and successful w BIPAP 10/3 [facility], prevents daytime sleepiness.  03/31/14- 5 yo M former smoker followed for asthma, allergic rhinitis, OSA,   Hx sputum Cx Tsukamurella sp ( similar to [facility]),  complicated by obesity.   PCP Dr [name] Glatter  sleep apnea & asthma; sleeps 7-8 hrs nightly; SOB is better; BIPAP 10/3/ [facility] He continues to use his BiPAP machine all night every night and it helps his sleeping. He remains medically necessary. ENT diagnosed his ear pain as TMJ treating with ibuprofen. Patient would like to try again with eardrops which his own drugstore could not provide. Chronic wheeze has been worse in the last few days. He felt Elwin Sleight was too expensive, nebulizer machine at home was not helpful. He keeps a prescription for albuterol rescue inhaler. Patient prefers to treat himself with "essential oil" by aerosol diffuser. Ingredients are eucalyptus, peppermint, tea tree, lemon, Lavender, Cardamom and Bay.  ROS-see HPI Constitutional:   No-   weight loss, night sweats, fevers, chills, +fatigue, lassitude. HEENT:   No-  headaches, difficulty swallowing, tooth/dental problems, sore throat,       No-  sneezing, itching, ear ache, nasal congestion, post nasal drip,  CV:  No-   chest pain, orthopnea, PND, swelling in lower extremities, anasarca, dizziness, palpitations Resp: + shortness of breath with exertion or at rest.              + productive cough,  + non-productive cough,  No- coughing up of blood.              No-   change in color of mucus.  + wheezing.   Skin: No-   rash or lesions. GI:  No  heartburn, indigestion, abdominal  pain, nausea, vomiting,  GU: . MS:  No-   joint pain or swelling.  . Neuro-     nothing unusual Psych:  No- change in mood or affect. No depression or anxiety.  No memory loss.   Objective:  OBJ- Physical Exam General- Alert, Oriented, Affect-appropriate, Distress- none acute, +morbid obesity Skin- rash-none, lesions- none, excoriation- none Lymphadenopathy- none Head- atraumatic            Eyes- Gross vision intact, PERRLA, conjunctivae and secretions clear            Ears- Hearing, canals-normal            Nose- Clear, no-Septal dev, mucus, polyps, erosion, perforation             Throat- Mallampati II , mucosa clear , drainage- none, tonsils- atrophic Neck- flexible , trachea midline, no stridor , thyroid nl, carotid  no bruit Chest - symmetrical excursion , unlabored           Heart/CV- +slow pulse increased w exertion getting on exam table, occ early beat , no murmur, no gallop  , no rub, nl s1 s2                           - JVD- none , edema- none, stasis changes- none, varices- none           Lung- +raspy on forced exertion, wheeze-+mild chronic, unlabored, dullness-none,                             rub- none           Chest wall-  Abd-  Br/ Gen/ Rectal- Not done, not indicated Extrem- cyanosis- none, clubbing, none, atrophy- none, strength- nl Neuro- grossly intact to observation

## 2014-03-31 NOTE — Assessment & Plan Note (Signed)
Plan- ok to try Auralgan ear drops for therapeutic effect on pain relief since ENT has ruled out significant ear pathology

## 2014-05-18 ENCOUNTER — Telehealth: Payer: Self-pay | Admitting: Internal Medicine

## 2014-05-18 MED ORDER — PREDNISONE 20 MG PO TABS: ORAL_TABLET | ORAL | Status: DC

## 2014-05-18 MED ORDER — MOMETASONE FURO-FORMOTEROL FUM 200-5 MCG/ACT IN AERO: 2.0000 | INHALATION_SPRAY | Freq: Two times a day (BID) | RESPIRATORY_TRACT | Status: AC

## 2014-05-18 MED ORDER — AZITHROMYCIN 250 MG PO TABS: ORAL_TABLET | ORAL | Status: DC

## 2014-05-18 NOTE — Telephone Encounter (Signed)
Per-SN we can give patient Zpak # 1 take as directed (phelgm is clear in color) and Prednisone 20 mg #12 take 1 po BID x 3 days, 1 po QD x 3 days, 1/2 tablet QD until gone no refills for either RX.

## 2014-05-18 NOTE — Telephone Encounter (Signed)
Spoke with pt, c/o prod cough with clear mucus, sob, wheezing X2-3 weeks.  Denies fever.  Is requesting pred taper and augmentin.  Pt has been taking his maintenance meds only.   Pt also needs dulera 200 samples, these have been placed up front.  [facility] [address]    Sending to Dr. Kriste BasqueNadel since CY is unavailable.  Please advise.  Thanks!

## 2014-05-18 NOTE — Telephone Encounter (Signed)
Pt aware of recs.  meds sent to pharmacy.  Nothing further needed.  

## 2014-08-11 ENCOUNTER — Encounter: Payer: Self-pay | Admitting: Internal Medicine

## 2014-08-11 ENCOUNTER — Ambulatory Visit: Payer: Medicare Other | Admitting: Internal Medicine

## 2014-08-11 VITALS — BP 120/74 | HR 56 | Ht 68.0 in | Wt 298.8 lb

## 2014-08-11 DIAGNOSIS — G4733 Obstructive sleep apnea (adult) (pediatric): Secondary | ICD-10-CM

## 2014-08-11 MED ORDER — MOMETASONE FURO-FORMOTEROL FUM 200-5 MCG/ACT IN AERO: 1.0000 | INHALATION_SPRAY | Freq: Every day | RESPIRATORY_TRACT | Status: AC

## 2014-08-11 NOTE — Patient Instructions (Signed)
Ok to continue VaughnDulera 1 puff daily while that works for you.   We are continuing BIPAP 10/3 [facility]   Please call as needed

## 2014-08-11 NOTE — Progress Notes (Signed)
Patient ID: [name] [name], male    DOB: [birth year], 80 y.o.   MRN: [id number]  HPI 05/29/10-80 yo M former smoker followed for asthma, sleep apnea, complicated by obesity.  Last here November 28, 2009 with no problems over the winter. 3 weeks ago with sustained time working out in yard he developed by bad sustained cough with wheeze and some phlegm. Current meds gradually controlled. Refers to going to gym, but says with any sustained walking- longer parking lot etc, he has to stop repeatedly to catch his breath. Asks [facility] parking. Today he admits only very occasional cough with trace yellow mucus. Takes zyrtec with good control. He continues compliant with BiPAP, new machine, still on 10/3. He can sleep much better with this one. Still occasional nap in chair.   12/05/10- 80 yo M former smoker followed for asthma, sleep apnea, complicated by obesity.  He continues to feel raspiness in his upper airways. His primary physician gave Singulair which he has taken for 3 months, without evident effect. Using his rescue inhaler about one time per week, off and just before he goes to exercise at the [facility]. He continues Symbicort, 2 puffs once daily. Cost is a problem. Triggers for him include temperature change and brisk walking but he denies any awareness of reflux. He continues using his BiPAP machine 10/3/[facility], all night every night with good control.  04/24/11-  80 yo M former smoker followed for asthma, sleep apnea, complicated by obesity.  Increased shortness of breath/asthma over the past month with gradual progression. Best peak flow was 440 but today she only got 230. After medication she reached 320. Raspy cough and tussive back pain but little sputum. Denies fever. Was using nebulizer albuterol and budesonide. Now [employer] insurance does not want to cover nebulized budesonide. Singulair was no help.  06/05/11- 80 yo M former smoker followed for asthma, sleep apnea, complicated by  obesity.   PCP Dr [name] Wears BiPAP every night for approx 6-8 hours at night; Using nebulizers in morning, using Symbicort at noon and at night. 05-02-11 still had swelling in feet and legs, 05-16-11 began" jucing"-asthma and things were getting better; peak flow was around 450. 05-18-11 was able to "belly laugh" without coughing (first time in years) 05-25-11-was helping with yard work,sat down on deck, later that night had fever and chills(felt almost out of it-almost called 911 due to feeling so bad). Resolved with gradual clearing of sweats over next week on his own. Singulair with no help. "Loves" BiPap.  08/15/11- 80 yo M former smoker followed for asthma, sleep apnea, complicated by obesity.   PCP Dr [name] Pt on Bipap.  uses approx 8 hours 7 days weeks.pt states has some wheezing  cough,. has some gout issues   Resolved acute asthmatic bronchitis at last visit. Since then and long-term, as tended to wheeze, on most days. [name] helped but was too expensive. Medications reviewed. Complains today of gout in right foot-not calf pain.   11/13/11-77 yo M former smoker followed for asthma, sleep apnea, complicated by obesity.   PCP Dr [name] Wears bipap 10/3 [employer],wears avg. 8 hrs.,fitting good,sob increased x  2-3 wks. ,wheezing,no cp or tightness,sweats We called in prednisone in September for acute exacerbation of asthma that helped. Wheeze and cough are gradually coming back. Cough is productive of white sputum. Using Symbicort irregularly and nebulized budesonide only once daily. We discussed the importance of appropriate use of his maintenance controller medications.  03/18/12- -  80 yo M former smoker followed for asthma, sleep apnea, complicated by obesity.   PCP Dr [name] FOLLOWS FOR: still having wheezing and SOB as well(this winter has been tough-not going to the [facility]). Wears BiPAP 10/3 [employer] every night for about 5-10 hours depending on sleep time Has concerns about billing  practices at [facility] and would like to explore alternatives. Persistent wheeze tends to be worse in winter. Using neb in AM, Dulera just once daily due to cost. Discussed 34-month ordering and his formulary. Not exercising and has gained weight. Cough disturbs sleep.   04/29/2012 Acute OV  Complains of head congestion with light yellow/foamy clear mucus, bilateral ear congestion, PND, wheezing, prod cough with same-colored mucus x2 weeks. Doing well on Dulera until last 2 weeks.  Wheezing is keeping him awake. Cough is worse at night.  Has sinus congestion , drainage , nasal stuffiness, sinus pressure.  Wearing BIPAP At bedtime .  No hemoptysis, fever, or edema.  Mucinex is not helping   06/05/12- 60 yo M former smoker followed for asthma, sleep apnea, complicated by obesity.   PCP Dr [name] ACUTE VISIT: saw TP 04-2012; can't shake infection off. Was given 2 rds of abx-Augmentin. Can hear him self talking-ears. Also tried sudafed. Nasal pressure/congestion.  NP visit 4/8- treated with Augmentin x2 courses, plus prednisone. This helped.  He visited [place]. Brother was diagnosed lung cancer "Sarcosatoid carcinoma".  Continues BiPAP 10/3 [employer]. CXR 03/24/12 IMPRESSION:  No evidence of acute cardiopulmonary disease.  Original Report Authenticated By: Charline Bills, M.D.  07/17/12- 63 yo M former smoker followed for asthma, sleep apnea, complicated by obesity.   PCP Dr Jillene Bucks for: Breathing has unchanged since his last visit. He states that astelin and fluticasone have helped with nasal congestion. Has occ prod cough, with minimal to moderate clear sputum. He is c/o night sweats for "quite a while now".  07/24/12-  63 yo M former smoker followed for asthma, sleep apnea, complicated by obesity.   PCP Dr [name] allergic rhinitis---here for asper/mold test Allergy Profile-total IgE 193.3 with specific elevations only for Aspergillus Skin test for Aspergillus with  Controls 07/24/12-  positive for histamine but negative for Aspergillus We are completing application for Xolair after appropriate discussion. Sputum is being sent for fungal, routine and AFB smear and culture and Quant TB assay is ordered.  09/30/12- 27 yo M former smoker followed for asthma, sleep apnea, complicated by obesity.   PCP Dr [name] allergic rhinitis- FOLLOWS FOR: Symptoms unchanged since last OV. Wearing BIPAP 10/3/ [employer], 8 hours per night.  Would like to discuss getting supplies  Cough is productive white/yellow. Grew atypical- discussed.  Dulera ok. Nebulizer little help. Pending echocardiogram. Metformin now for borderline DM. We talked about Xolair again but it is too expensive.  12/30/12- 55 yo M former smoker followed for asthma, allergic rhinitis-  Cx Tsukamurella sp similar to MAICsleep apnea, complicated by obesity.   PCP Dr [name]  FOLLOWS FOR: has bouts of coughing spells; good and bad days overall. Has not used nebulizers in about 3 months or so; could not tell they were helping. BIPAP 10/3/ [facility] Gained weight on vacation. Activity more limited now by arthritis in knees. Still paroxysmal wheeze and cough without change in pattern. Cough can be productive with Mucinex but sputum is usually white. Failed Spiriva. Xolair was too expensive. Elwin Sleight is better than nothing.  04/01/13- 74 yo M former smoker followed for asthma, allergic rhinitis-  Cx Tsukamurella sp( similar to [facility]),  sleep apnea, complicated by obesity.   PCP Dr [name]  FOLLOWS FOR:  Still having some cough, wheezing, sob with exertion BIPAP 10/3/ [facility] seemed to cause very bad cough. He does better with [facility] but always has some wheeze and cough. At times his chest is almost clear. He found nebulizer and Singulair little help.  09/30/13- 55 yo M former smoker followed for asthma, allergic rhinitis, OSA,   Hx sputum Cx Tsukamurella sp ( similar to [facility]),  complicated by obesity.   PCP Dr [name]  FOLLOWS FOR:  Feels better now-would like to have Pred taper and Augmentin Rx to hold in case another asthma flare up as it did 08-25-13. Nurse noted slow/ irregular pulse on arrival-EKG 09/30/13- 1degAVB, sinus at 67/min, PACs Resolved exacerbation after last visit. Usually some wheeze. Needs prednisone about every 3-4 months- most recently early August. Uses Dulera. Compliant and successful w BIPAP 10/3 [employer], prevents daytime sleepiness.  03/31/14- 7 yo M former smoker followed for asthma, allergic rhinitis, OSA,   Hx sputum Cx Tsukamurella sp ( similar to [facility]),  complicated by obesity.   PCP Dr [name]  sleep apnea & asthma; sleeps 7-8 hrs nightly; SOB is better; BIPAP 10/3/ [facility] He continues to use his BiPAP machine all night every night and it helps his sleeping. He remains medically necessary. ENT diagnosed his ear pain as TMJ treating with ibuprofen. Patient would like to try again with eardrops which his own drugstore could not provide. Chronic wheeze has been worse in the last few days. He felt Elwin Sleight was too expensive, nebulizer machine at home was not helpful. He keeps a prescription for albuterol rescue inhaler. Patient prefers to treat himself with "essential oil" by aerosol diffuser. Ingredients are eucalyptus, peppermint, tea tree, lemon, Lavender, Cardamom and Bay.  08/11/14-  40 yo M former smoker followed for asthma, allergic rhinitis, OSA,   Hx sputum Cx Tsukamurella sp ( similar to [facility]),  complicated by obesity.   PCP Dr [name]  FOLLOWS FOR: DME is [facility]-Wears BiPAP 10/3 every night. Pt states his breathing is great at this time. Using Dulera 1 puff QD instead of 2 puffs BID due to cost of medication.    ROS-see HPI Constitutional:   No-   weight loss, night sweats, fevers, chills, +fatigue, lassitude. HEENT:   No-  headaches, difficulty swallowing, tooth/dental problems, sore throat,       No-  sneezing, itching, ear ache, nasal congestion, post nasal drip,  CV:  No-   chest pain,  orthopnea, PND, swelling in lower extremities, anasarca, dizziness, palpitations Resp: + shortness of breath with exertion or at rest.              + productive cough,  + non-productive cough,  No- coughing up of blood.              No-   change in color of mucus.  + wheezing.   Skin: No-   rash or lesions. GI:  No  heartburn, indigestion, abdominal pain, nausea, vomiting,  GU: . MS:  No-   joint pain or swelling.  . Neuro-     nothing unusual Psych:  No- change in mood or affect. No depression or anxiety.  No memory loss.   Objective:  OBJ- Physical Exam General- Alert, Oriented, Affect-appropriate, Distress- none acute, +morbid obesity Skin- rash-none, lesions- none, excoriation- none Lymphadenopathy- none Head- atraumatic            Eyes- Gross vision intact, PERRLA, conjunctivae  and secretions clear            Ears- Hearing, canals-normal            Nose- Clear, no-Septal dev, mucus, polyps, erosion, perforation             Throat- Mallampati II , mucosa clear , drainage- none, tonsils- atrophic Neck- flexible , trachea midline, no stridor , thyroid nl, carotid no bruit Chest - symmetrical excursion , unlabored           Heart/CV- +slow pulse increased w exertion getting on exam table, occ early beat , no murmur, no gallop  , no rub, nl s1 s2                           - JVD- none , edema- none, stasis changes- none, varices- none           Lung- +raspy on forced exertion, wheeze-+mild chronic, unlabored, dullness-none,                             rub- none           Chest wall-  Abd-  Br/ Gen/ Rectal- Not done, not indicated Extrem- cyanosis- none, clubbing, none, atrophy- none, strength- nl Neuro- grossly intact to observation

## 2014-11-16 ENCOUNTER — Encounter: Payer: Self-pay | Admitting: Internal Medicine

## 2014-11-18 ENCOUNTER — Other Ambulatory Visit (HOSPITAL_COMMUNITY): Payer: Self-pay | Admitting: Geriatric Medicine

## 2014-11-18 DIAGNOSIS — I77810 Thoracic aortic ectasia: Secondary | ICD-10-CM

## 2014-12-13 ENCOUNTER — Telehealth: Payer: Self-pay | Admitting: Internal Medicine

## 2014-12-13 MED ORDER — MOMETASONE FURO-FORMOTEROL FUM 200-5 MCG/ACT IN AERO: 2.0000 | INHALATION_SPRAY | Freq: Two times a day (BID) | RESPIRATORY_TRACT | Status: DC

## 2014-12-13 NOTE — Telephone Encounter (Signed)
Patient sent a fax that read as follows:  From [name] FAX [phone]  SUBJECT: [name] SleightDulera and Next appt 02/05/2015  2017 Health Care Provider will be [employer] Member [phone]  "Hi folks, still using 1 puff Dulera 200 each morning and that bodes well for my Asthma problem.  Have 12 more doses in my inhaler & if you have a 60 count sample that would get me through to my appointment next year.  Dr. Maple HudsonYoung and I will need to talk about testing another inhaler brand during my 24 January appointment time. Please let me know and hope all of you are well and fit and ready for a good Christmas! [name] [name]"

## 2014-12-13 NOTE — Telephone Encounter (Signed)
Spoke with pt, confirmed appt, advised I would place 1 sample of Dulera up front for pick up.  Pt expressed understanding.  Nothing further needed.

## 2015-01-03 ENCOUNTER — Telehealth: Payer: Self-pay | Admitting: Internal Medicine

## 2015-01-03 ENCOUNTER — Other Ambulatory Visit: Payer: Self-pay | Admitting: Internal Medicine

## 2015-01-03 MED ORDER — PREDNISONE 10 MG PO TABS: ORAL_TABLET | ORAL | Status: DC

## 2015-01-03 MED ORDER — AMOXICILLIN 500 MG PO TABS: 500.0000 mg | ORAL_TABLET | Freq: Three times a day (TID) | ORAL | Status: DC

## 2015-01-03 NOTE — Telephone Encounter (Signed)
Spoke with pt. He is aware of CY's recommendation. Prednisone has been sent in. Pt would like an prescription for Amoxicillin to be called in as well.  CY - please advise. Thanks.  **pt does not want a call back, if med is approved just send to his pharmacy.**

## 2015-01-03 NOTE — Telephone Encounter (Signed)
Ok prednisone 10 mg, # 20, 4 X 2 DAYS, 3 X 2 DAYS, 2 X 2 DAYS, 1 X 2 DAYS  

## 2015-01-03 NOTE — Telephone Encounter (Signed)
Spoke with pt.  C/o head and chest congestion, runny nose (yellow), prod cough (clear), wheezing, diff sleeping due to cough.  Pt states he normally responds to a pred taper for this.  Please advise.  No Known Allergies  Current Outpatient Prescriptions on File Prior to Visit  Medication Sig Dispense Refill  . albuterol (PROAIR HFA) 108 (90 BASE) MCG/ACT inhaler Inhale 2 puffs into the lungs every 6 (six) hours as needed.      Marland Kitchen. amLODipine (NORVASC) 10 MG tablet Take 10 mg by mouth daily.      Marland Kitchen. aspirin 81 MG tablet Take 81 mg by mouth daily.      Marland Kitchen. atenolol (TENORMIN) 25 MG tablet Take 25 mg by mouth Daily.     . Cetirizine HCl 10 MG CAPS Take 1 capsule by mouth at bedtime.      Tery Sanfilippo. Docusate Calcium (STOOL SOFTENER PO) Take 2 capsules by mouth at bedtime.      . fluticasone (FLONASE) 50 MCG/ACT nasal spray USE 2 SPRAYS IN EACH NOSTRIL AT BEDTIME 16 g 6  . furosemide (LASIX) 20 MG tablet Take 20 mg by mouth daily.      Marland Kitchen. losartan (COZAAR) 100 MG tablet Take 100 mg by mouth daily.      . metFORMIN (GLUCOPHAGE-XR) 500 MG 24 hr tablet Take 1 tablet by mouth daily.    . mometasone-formoterol (DULERA) 200-5 MCG/ACT AERO Inhale 2 puffs into the lungs 2 (two) times daily. 2 Inhaler 0  . mometasone-formoterol (DULERA) 200-5 MCG/ACT AERO Inhale 2 puffs into the lungs 2 (two) times daily. 1 Inhaler 0  . Multiple Vitamins-Minerals (PRESERVISION AREDS) CAPS Take by mouth.    . Omega-3 Fatty Acids (FISH OIL) 1000 MG CAPS Take by mouth. 1 in the morning and 1 at night    . pravastatin (PRAVACHOL) 80 MG tablet 80 mg Daily.    . Pseudoephedrine-Guaifenesin (MUCUS RELIEF D PO) Take by mouth. 1 in the morning and 1 at noon     No current facility-administered medications on file prior to visit.

## 2015-01-03 NOTE — Telephone Encounter (Signed)
Per CY >> this is fine. Send in Amoxicillin 500mg  1 TID #21. Rx has been sent in. Nothing further was needed.

## 2015-01-31 ENCOUNTER — Other Ambulatory Visit (HOSPITAL_COMMUNITY): Payer: Medicare Other

## 2015-02-07 DIAGNOSIS — H353112 Nonexudative age-related macular degeneration, right eye, intermediate dry stage: Secondary | ICD-10-CM | POA: Diagnosis not present

## 2015-02-07 DIAGNOSIS — H2513 Age-related nuclear cataract, bilateral: Secondary | ICD-10-CM | POA: Diagnosis not present

## 2015-02-07 DIAGNOSIS — E119 Type 2 diabetes mellitus without complications: Secondary | ICD-10-CM | POA: Diagnosis not present

## 2015-02-07 DIAGNOSIS — H353122 Nonexudative age-related macular degeneration, left eye, intermediate dry stage: Secondary | ICD-10-CM | POA: Diagnosis not present

## 2015-02-15 ENCOUNTER — Encounter: Payer: Self-pay | Admitting: Internal Medicine

## 2015-02-15 ENCOUNTER — Ambulatory Visit (INDEPENDENT_AMBULATORY_CARE_PROVIDER_SITE_OTHER): Payer: PPO | Admitting: Internal Medicine

## 2015-02-15 VITALS — BP 124/62 | HR 53 | Ht 68.0 in | Wt 305.2 lb

## 2015-02-15 DIAGNOSIS — G4733 Obstructive sleep apnea (adult) (pediatric): Secondary | ICD-10-CM

## 2015-02-15 DIAGNOSIS — E669 Obesity, unspecified: Secondary | ICD-10-CM

## 2015-02-15 DIAGNOSIS — J45909 Unspecified asthma, uncomplicated: Secondary | ICD-10-CM | POA: Diagnosis not present

## 2015-02-15 DIAGNOSIS — J449 Chronic obstructive pulmonary disease, unspecified: Secondary | ICD-10-CM

## 2015-02-15 NOTE — Progress Notes (Signed)
Patient ID: [name] [name], male    DOB: [birth year], 81 y.o.   MRN: [id number]  HPI 05/29/10-81 yo M former smoker followed for asthma, sleep apnea, complicated by obesity.  Last here November 28, 2009 with no problems over the winter. 3 weeks ago with sustained time working out in yard he developed by bad sustained cough with wheeze and some phlegm. Current meds gradually controlled. Refers to going to gym, but says with any sustained walking- longer parking lot etc, he has to stop repeatedly to catch his breath. Asks [facility] parking. Today he admits only very occasional cough with trace yellow mucus. Takes zyrtec with good control. He continues compliant with BiPAP, new machine, still on 10/3. He can sleep much better with this one. Still occasional nap in chair.   12/05/10- 81 yo M former smoker followed for asthma, sleep apnea, complicated by obesity.  He continues to feel raspiness in his upper airways. His primary physician gave Singulair which he has taken for 3 months, without evident effect. Using his rescue inhaler about one time per week, off and just before he goes to exercise at the [facility]. He continues Symbicort, 2 puffs once daily. Cost is a problem. Triggers for him include temperature change and brisk walking but he denies any awareness of reflux. He continues using his BiPAP machine 10/3/[facility], all night every night with good control.  04/24/11-  81 yo M former smoker followed for asthma, sleep apnea, complicated by obesity.  Increased shortness of breath/asthma over the past month with gradual progression. Best peak flow was 440 but today she only got 230. After medication she reached 320. Raspy cough and tussive back pain but little sputum. Denies fever. Was using nebulizer albuterol and budesonide. Now [employer] insurance does not want to cover nebulized budesonide. Singulair was no help.  06/05/11- 81 yo M former smoker followed for asthma, sleep apnea, complicated by  obesity.   PCP Dr [name] Wears BiPAP every night for approx 6-8 hours at night; Using nebulizers in morning, using Symbicort at noon and at night. 05-02-11 still had swelling in feet and legs, 05-16-11 began" jucing"-asthma and things were getting better; peak flow was around 450. 05-18-11 was able to "belly laugh" without coughing (first time in years) 05-25-11-was helping with yard work,sat down on deck, later that night had fever and chills(felt almost out of it-almost called 911 due to feeling so bad). Resolved with gradual clearing of sweats over next week on his own. Singulair with no help. "Loves" BiPap.  08/15/11- 81 yo M former smoker followed for asthma, sleep apnea, complicated by obesity.   PCP Dr [name] Pt on Bipap.  uses approx 8 hours 7 days weeks.pt states has some wheezing  cough,. has some gout issues   Resolved acute asthmatic bronchitis at last visit. Since then and long-term, as tended to wheeze, on most days. [name] helped but was too expensive. Medications reviewed. Complains today of gout in right foot-not calf pain.   11/13/11-77 yo M former smoker followed for asthma, sleep apnea, complicated by obesity.   PCP Dr [name] Wears bipap 10/3 [employer],wears avg. 8 hrs.,fitting good,sob increased x  2-3 wks. ,wheezing,no cp or tightness,sweats We called in prednisone in September for acute exacerbation of asthma that helped. Wheeze and cough are gradually coming back. Cough is productive of white sputum. Using Symbicort irregularly and nebulized budesonide only once daily. We discussed the importance of appropriate use of his maintenance controller medications.  03/18/12- -  81 yo M former smoker followed for asthma, sleep apnea, complicated by obesity.   PCP Dr [name] FOLLOWS FOR: still having wheezing and SOB as well(this winter has been tough-not going to the [facility]). Wears BiPAP 10/3 [employer] every night for about 5-10 hours depending on sleep time Has concerns about billing  practices at [place] and would like to explore alternatives. Persistent wheeze tends to be worse in winter. Using neb in AM, Dulera just once daily due to cost. Discussed 22-month ordering and his formulary. Not exercising and has gained weight. Cough disturbs sleep.   04/29/2012 Acute OV  Complains of head congestion with light yellow/foamy clear mucus, bilateral ear congestion, PND, wheezing, prod cough with same-colored mucus x2 weeks. Doing well on Dulera until last 2 weeks.  Wheezing is keeping him awake. Cough is worse at night.  Has sinus congestion , drainage , nasal stuffiness, sinus pressure.  Wearing BIPAP At bedtime .  No hemoptysis, fever, or edema.  Mucinex is not helping   06/05/12- 30 yo M former smoker followed for asthma, sleep apnea, complicated by obesity.   PCP Dr [name] ACUTE VISIT: saw TP 04-2012; can't shake infection off. Was given 2 rds of abx-Augmentin. Can hear him self talking-ears. Also tried sudafed. Nasal pressure/congestion.  NP visit 4/8- treated with Augmentin x2 courses, plus prednisone. This helped.  He visited [place]. Brother was diagnosed lung cancer "Sarcosatoid carcinoma".  Continues BiPAP 10/3 [employer]. CXR 03/24/12 IMPRESSION:  No evidence of acute cardiopulmonary disease.  Original Report Authenticated By: Charline Bills, M.D.  07/17/12- 30 yo M former smoker followed for asthma, sleep apnea, complicated by obesity.   PCP Dr Jillene Bucks for: Breathing has unchanged since his last visit. He states that astelin and fluticasone have helped with nasal congestion. Has occ prod cough, with minimal to moderate clear sputum. He is c/o night sweats for "quite a while now".  07/24/12-  31 yo M former smoker followed for asthma, sleep apnea, complicated by obesity.   PCP Dr [name] allergic rhinitis---here for asper/mold test Allergy Profile-total IgE 193.3 with specific elevations only for Aspergillus Skin test for Aspergillus with  Controls 07/24/12-  positive for histamine but negative for Aspergillus We are completing application for Xolair after appropriate discussion. Sputum is being sent for fungal, routine and AFB smear and culture and Quant TB assay is ordered.  09/30/12- 82 yo M former smoker followed for asthma, sleep apnea, complicated by obesity.   PCP Dr [name] allergic rhinitis- FOLLOWS FOR: Symptoms unchanged since last OV. Wearing BIPAP 10/3/ [employer], 8 hours per night.  Would like to discuss getting supplies  Cough is productive white/yellow. Grew atypical- discussed.  Dulera ok. Nebulizer little help. Pending echocardiogram. Metformin now for borderline DM. We talked about Xolair again but it is too expensive.  12/30/12- 77 yo M former smoker followed for asthma, allergic rhinitis-  Cx Tsukamurella sp similar to MAICsleep apnea, complicated by obesity.   PCP Dr [name]  FOLLOWS FOR: has bouts of coughing spells; good and bad days overall. Has not used nebulizers in about 3 months or so; could not tell they were helping. BIPAP 10/3/ [employer] Gained weight on vacation. Activity more limited now by arthritis in knees. Still paroxysmal wheeze and cough without change in pattern. Cough can be productive with Mucinex but sputum is usually white. Failed Spiriva. Xolair was too expensive. Elwin Sleight is better than nothing.  04/01/13- 5 yo M former smoker followed for asthma, allergic rhinitis-  Cx Tsukamurella sp( similar to [facility]),  sleep apnea, complicated by obesity.   PCP Dr [name]  FOLLOWS FOR:  Still having some cough, wheezing, sob with exertion BIPAP 10/3/ [facility] seemed to cause very bad cough. He does better with [facility] but always has some wheeze and cough. At times his chest is almost clear. He found nebulizer and Singulair little help.  09/30/13- 71 yo M former smoker followed for asthma, allergic rhinitis, OSA,   Hx sputum Cx Tsukamurella sp ( similar to [facility]),  complicated by obesity.   PCP Dr [name]  FOLLOWS FOR:  Feels better now-would like to have Pred taper and Augmentin Rx to hold in case another asthma flare up as it did 08-25-13. Nurse noted slow/ irregular pulse on arrival-EKG 09/30/13- 1degAVB, sinus at 67/min, PACs Resolved exacerbation after last visit. Usually some wheeze. Needs prednisone about every 3-4 months- most recently early August. Uses Dulera. Compliant and successful w BIPAP 10/3 [employer], prevents daytime sleepiness.  03/31/14- 4 yo M former smoker followed for asthma, allergic rhinitis, OSA,   Hx sputum Cx Tsukamurella sp ( similar to [facility]),  complicated by obesity.   PCP Dr [name]  sleep apnea & asthma; sleeps 7-8 hrs nightly; SOB is better; BIPAP 10/3/ [employer] He continues to use his BiPAP machine all night every night and it helps his sleeping. He remains medically necessary. ENT diagnosed his ear pain as TMJ treating with ibuprofen. Patient would like to try again with eardrops which his own drugstore could not provide. Chronic wheeze has been worse in the last few days. He felt Elwin Sleight was too expensive, nebulizer machine at home was not helpful. He keeps a prescription for albuterol rescue inhaler. Patient prefers to treat himself with "essential oil" by aerosol diffuser. Ingredients are eucalyptus, peppermint, tea tree, lemon, Lavender, Cardamom and Bay.  08/11/14-  13 yo M former smoker followed for asthma, allergic rhinitis, OSA,   Hx sputum Cx Tsukamurella sp ( similar to [facility]),  complicated by obesity.   PCP Dr [name]  FOLLOWS FOR: DME is [facility]-Wears BiPAP 10/3 every night. Pt states his breathing is great at this time. Using Dulera 1 puff QD instead of 2 puffs BID due to cost of medication.  02/15/2015-81 year old male former smoker followed for asthma, allergic rhinitis, OSA, history sputum Cx Tsukamurella sp  ( similar to [facility]),  complicated by obesity.   PCP Dr [name]  FOLLOWS FOR: DME: [facility]. Wears CPAP nightly. Denies problems with pressure or mask. Pt reports no  change in breathing since last OV. Using [facility] as directed BID. Pt notes some congestion in chest - Productive cough with clear/pale in color.  BiPAP 10/3/[facility] He continues BiPAP all night every night without concern. No significant lifestyle change to lose weight. Using Dulera 1 puff daily. He considers this sufficient although we started it to save money. Eating a prednisone taper about every 4 months. Using "Essential Oils" in a diffuser, which he says helps his breathing. Admits some wheeze most days. Breathing does not wake him. Occasional rescue inhaler.  ROS-see HPI Constitutional:   No-   weight loss, night sweats, fevers, chills, +fatigue, lassitude. HEENT:   No-  headaches, difficulty swallowing, tooth/dental problems, sore throat,       No-  sneezing, itching, ear ache, nasal congestion, post nasal drip,  CV:  No-   chest pain, orthopnea, PND, swelling in lower extremities, anasarca, dizziness, palpitations Resp: + shortness of breath with exertion or at rest.              +  productive cough,  + non-productive cough,  No- coughing up of blood.              No-   change in color of mucus.  + wheezing.   Skin: No-   rash or lesions. GI:  No  heartburn, indigestion, abdominal pain, nausea, vomiting,  GU: . MS:  No-   joint pain or swelling.  . Neuro-     nothing unusual Psych:  No- change in mood or affect. No depression or anxiety.  No memory loss.   Objective:  OBJ- Physical Exam General- Alert, Oriented, Affect-appropriate, Distress- none acute, +morbid obesity Skin- rash-none, lesions- none, excoriation- none Lymphadenopathy- none Head- atraumatic            Eyes- Gross vision intact, PERRLA, conjunctivae and secretions clear            Ears- Hearing, canals-normal            Nose- Clear, no-Septal dev, mucus, polyps, erosion, perforation             Throat- Mallampati II , mucosa clear , drainage- none, tonsils- atrophic Neck- flexible , trachea midline, no stridor ,  thyroid nl, carotid no bruit Chest - symmetrical excursion , unlabored           Heart/CV- +slow pulse increased w exertion getting on exam table, occ early beat , no murmur, no gallop  , no rub, nl s1 s2                           - JVD- none , edema- none, stasis changes- none, varices- none           Lung-  wheeze-+mild chronic, unlabored, dullness-none,                             rub- none           Chest wall-  Abd-  Br/ Gen/ Rectal- Not done, not indicated Extrem- cyanosis- none, clubbing, none, atrophy- none, strength- nl Neuro- grossly intact to observation

## 2015-02-15 NOTE — Patient Instructions (Signed)
We can continue BIPAP 10/3  [facility]   You can watch your insurance formulary to see what they cover in comparison to Dulera  Samples x 2 Dulera 200 x 2  If available

## 2015-02-20 NOTE — Assessment & Plan Note (Signed)
Finances impact his treatment choices. He feels stable and controlled although he does wheeze. We discussed comparison of coverage for meds like Dulera.

## 2015-02-20 NOTE — Assessment & Plan Note (Signed)
Good compliance and control. Settings are appropriate and usages good. It definitely improves his quality of life.

## 2015-02-20 NOTE — Assessment & Plan Note (Signed)
He has not made effort to lose weight.

## 2015-02-21 ENCOUNTER — Telehealth: Payer: Self-pay | Admitting: Internal Medicine

## 2015-02-21 NOTE — Telephone Encounter (Signed)
Called and spoke with pt. He stated he needs his last ov note and and med list faxed to [phone]. I explained to him that I would send the forms to the faxed # provided. Pt voiced understanding and had no further questions.  I printed and faxed forms. Nothing further needed at this time.

## 2015-02-24 DIAGNOSIS — H25811 Combined forms of age-related cataract, right eye: Secondary | ICD-10-CM | POA: Diagnosis not present

## 2015-02-24 DIAGNOSIS — H2511 Age-related nuclear cataract, right eye: Secondary | ICD-10-CM | POA: Diagnosis not present

## 2015-03-10 DIAGNOSIS — H2512 Age-related nuclear cataract, left eye: Secondary | ICD-10-CM | POA: Diagnosis not present

## 2015-03-10 DIAGNOSIS — H25812 Combined forms of age-related cataract, left eye: Secondary | ICD-10-CM | POA: Diagnosis not present

## 2015-03-16 ENCOUNTER — Other Ambulatory Visit (HOSPITAL_COMMUNITY): Payer: Self-pay | Admitting: Geriatric Medicine

## 2015-03-16 DIAGNOSIS — I77810 Thoracic aortic ectasia: Secondary | ICD-10-CM

## 2015-04-05 ENCOUNTER — Other Ambulatory Visit: Payer: Self-pay

## 2015-04-05 ENCOUNTER — Ambulatory Visit (HOSPITAL_COMMUNITY): Payer: PPO | Attending: Internal Medicine

## 2015-04-05 DIAGNOSIS — Z6841 Body Mass Index (BMI) 40.0 and over, adult: Secondary | ICD-10-CM | POA: Diagnosis not present

## 2015-04-05 DIAGNOSIS — G4733 Obstructive sleep apnea (adult) (pediatric): Secondary | ICD-10-CM | POA: Diagnosis not present

## 2015-04-05 DIAGNOSIS — I517 Cardiomegaly: Secondary | ICD-10-CM | POA: Insufficient documentation

## 2015-04-05 DIAGNOSIS — I340 Nonrheumatic mitral (valve) insufficiency: Secondary | ICD-10-CM | POA: Diagnosis not present

## 2015-04-05 DIAGNOSIS — E669 Obesity, unspecified: Secondary | ICD-10-CM | POA: Diagnosis not present

## 2015-04-05 DIAGNOSIS — I77810 Thoracic aortic ectasia: Secondary | ICD-10-CM | POA: Diagnosis not present

## 2015-04-05 LAB — ECHOCARDIOGRAM COMPLETE
A4CEF: 64 %
AOASC: 45 mm
AORTIC ROOT 2D: 40 mm
AVLVOTPG: 7 mmHg
CHL CUP DOP CALC LVOT VTI: 36 cm
CHL CUP LA SIZE INDEX: 1.92 mm/m2
CHL CUP LVOT MEAN VEL: 79.7 cm/s
ESTIMATED CVP: 3 mm HG
FS: 46 % — AB (ref 28–44)
IVS/LV PW RATIO, ED: 1.02
LA ID, A-P, ES: 51 mm
LA VOL 2D INDEX: 39.2 mL/m2
LA VOL 2D: 104 mL
LA diam end sys: 51 mm
LA vol index: 33.6 mL/m2
LAVOL: 89 mL
LV PW s: 10.6 mm
LVIDD: 55.1 mm — AB (ref 3.5–6.0)
LVIDS: 30 mm — AB (ref 2.1–4.0)
LVOTPV: 132 cm/s
PW: 10.6 mm — AB (ref 0.6–1.1)

## 2015-04-19 DIAGNOSIS — Z961 Presence of intraocular lens: Secondary | ICD-10-CM | POA: Diagnosis not present

## 2015-04-19 DIAGNOSIS — H2512 Age-related nuclear cataract, left eye: Secondary | ICD-10-CM | POA: Diagnosis not present

## 2015-04-26 ENCOUNTER — Telehealth: Payer: Self-pay | Admitting: Internal Medicine

## 2015-04-26 NOTE — Telephone Encounter (Signed)
Last ov 02/15/15 Patient Instructions     We can continue BIPAP 10/3 [facility]   You can watch your insurance formulary to see what they cover in comparison to Dulera  Samples x 2 Dulera 200 x 2 If available   Called and spoke with pt. I informed him that we do not have any samples of Dulera 200. He states that his health team [employer] insurance does not give [facility] an [facility] and he also states that he currently only has 4 days left of his Dulera. He is requesting CY's recs on a alternative. I explained to him that I would send a message to CY. He voiced understanding and had no further questions.   CY please advise   No Known Allergies   Current Outpatient Prescriptions on File Prior to Visit  Medication Sig Dispense Refill  . albuterol (PROAIR HFA) 108 (90 BASE) MCG/ACT inhaler Inhale 2 puffs into the lungs every 6 (six) hours as needed.      Marland Kitchen. amLODipine (NORVASC) 10 MG tablet Take 10 mg by mouth daily.      Marland Kitchen. amoxicillin (AMOXIL) 500 MG tablet Take 1 tablet (500 mg total) by mouth 3 (three) times daily. 21 tablet 0  . aspirin 81 MG tablet Take 81 mg by mouth daily.      Marland Kitchen. atenolol (TENORMIN) 25 MG tablet Take 25 mg by mouth Daily.     . Cetirizine HCl 10 MG CAPS Take 1 capsule by mouth at bedtime.      Tery Sanfilippo. Docusate Calcium (STOOL SOFTENER PO) Take 2 capsules by mouth at bedtime.      . fluticasone (FLONASE) 50 MCG/ACT nasal spray USE 2 SPRAYS IN EACH NOSTRIL AT BEDTIME 16 g 4  . furosemide (LASIX) 20 MG tablet Take 20 mg by mouth daily.      Marland Kitchen. losartan (COZAAR) 100 MG tablet Take 100 mg by mouth daily.      . metFORMIN (GLUCOPHAGE-XR) 500 MG 24 hr tablet Take 1 tablet by mouth daily.    . mometasone-formoterol (DULERA) 200-5 MCG/ACT AERO Inhale 2 puffs into the lungs 2 (two) times daily. 2 Inhaler 0  . Multiple Vitamins-Minerals (PRESERVISION AREDS) CAPS Take by mouth.    . Omega-3 Fatty Acids (FISH OIL) 1000 MG CAPS Take by mouth. 1 in the morning and 1 at night    .  pravastatin (PRAVACHOL) 80 MG tablet 80 mg Daily.    . predniSONE (DELTASONE) 10 MG tablet Take 4 tabs for 2 days, 3 tabs for 2 days, 2 tabs for 2 days, 1 tab for 2 days 20 tablet 0  . Pseudoephedrine-Guaifenesin (MUCUS RELIEF D PO) Take by mouth. 1 in the morning and 1 at noon     No current facility-administered medications on file prior to visit.

## 2015-04-26 NOTE — Telephone Encounter (Signed)
Similar products are Advair, Breo, Symbicort.  We can give whichever his insurance prefers. We do not get many samples anymore and can't maintain with samples

## 2015-04-26 NOTE — Telephone Encounter (Signed)
LVM for pt to return call

## 2015-04-27 ENCOUNTER — Telehealth: Payer: Self-pay | Admitting: Internal Medicine

## 2015-04-27 NOTE — Telephone Encounter (Signed)
Christen ButterLeslie M Raskin, CMA at 04/27/2015 8:56 AM     Status: Signed       Expand All Collapse All   Spoke with the pt and notified of recs per CDY  He verbalized understanding  He will call ins to find out which med is covered and then call to let us know        Spoke with pt, states that insurance will cover Advair, Breo, Anoro, or Symbicort in place of BronteDulera. Pt has tried Symbicort, worsened symptoms in 2012 Pt has tried Anoro in 2014, worsened cough  Pt notes he has been using 1 puff qam of dulera-wonders if he even needs a maintenance inhaler.    CY please advise on alternatives.  Thanks!

## 2015-04-27 NOTE — Telephone Encounter (Signed)
Spoke with the pt and notified of recs per CDY  He verbalized understanding  He will call ins to find out which med is covered and then call to let us know

## 2015-04-28 NOTE — Telephone Encounter (Signed)
He usually wheezes, and should use a maintenance inhaler if possible.  Suggest sample Breo 100     Inhale 1 puff then rinse mouth, once every day. He can check insurance coverage for this.

## 2015-04-28 NOTE — Telephone Encounter (Signed)
Spoke with pt and advised of Dr Roxy CedarYoung's recommendations.  Sample of Breo left at front desk.

## 2015-05-10 ENCOUNTER — Ambulatory Visit
Admission: RE | Admit: 2015-05-10 | Discharge: 2015-05-10 | Disposition: A | Discharge status: Home / Self Care | Payer: PPO | Source: Ambulatory Visit | Attending: Nurse Practitioner | Admitting: Nurse Practitioner

## 2015-05-10 ENCOUNTER — Other Ambulatory Visit: Payer: Self-pay | Admitting: Nurse Practitioner

## 2015-05-10 DIAGNOSIS — R0989 Other specified symptoms and signs involving the circulatory and respiratory systems: Secondary | ICD-10-CM

## 2015-05-10 DIAGNOSIS — I872 Venous insufficiency (chronic) (peripheral): Secondary | ICD-10-CM | POA: Diagnosis not present

## 2015-05-10 DIAGNOSIS — R0602 Shortness of breath: Secondary | ICD-10-CM | POA: Diagnosis not present

## 2015-05-10 DIAGNOSIS — T7840XA Allergy, unspecified, initial encounter: Secondary | ICD-10-CM | POA: Diagnosis not present

## 2015-05-10 DIAGNOSIS — R600 Localized edema: Secondary | ICD-10-CM | POA: Diagnosis not present

## 2015-05-17 DIAGNOSIS — Z79899 Other long term (current) drug therapy: Secondary | ICD-10-CM | POA: Diagnosis not present

## 2015-05-17 DIAGNOSIS — Z7984 Long term (current) use of oral hypoglycemic drugs: Secondary | ICD-10-CM | POA: Diagnosis not present

## 2015-05-17 DIAGNOSIS — E1140 Type 2 diabetes mellitus with diabetic neuropathy, unspecified: Secondary | ICD-10-CM | POA: Diagnosis not present

## 2015-05-17 DIAGNOSIS — Z6841 Body Mass Index (BMI) 40.0 and over, adult: Secondary | ICD-10-CM | POA: Diagnosis not present

## 2015-05-17 DIAGNOSIS — L821 Other seborrheic keratosis: Secondary | ICD-10-CM | POA: Diagnosis not present

## 2015-05-17 DIAGNOSIS — I872 Venous insufficiency (chronic) (peripheral): Secondary | ICD-10-CM | POA: Diagnosis not present

## 2015-05-17 DIAGNOSIS — M109 Gout, unspecified: Secondary | ICD-10-CM | POA: Diagnosis not present

## 2015-05-17 DIAGNOSIS — I10 Essential (primary) hypertension: Secondary | ICD-10-CM | POA: Diagnosis not present

## 2015-06-07 DIAGNOSIS — I10 Essential (primary) hypertension: Secondary | ICD-10-CM | POA: Diagnosis not present

## 2015-06-07 DIAGNOSIS — Z79899 Other long term (current) drug therapy: Secondary | ICD-10-CM | POA: Diagnosis not present

## 2015-06-07 DIAGNOSIS — J4540 Moderate persistent asthma, uncomplicated: Secondary | ICD-10-CM | POA: Diagnosis not present

## 2015-06-07 DIAGNOSIS — E1140 Type 2 diabetes mellitus with diabetic neuropathy, unspecified: Secondary | ICD-10-CM | POA: Diagnosis not present

## 2015-06-07 DIAGNOSIS — Z7984 Long term (current) use of oral hypoglycemic drugs: Secondary | ICD-10-CM | POA: Diagnosis not present

## 2015-06-07 DIAGNOSIS — I872 Venous insufficiency (chronic) (peripheral): Secondary | ICD-10-CM | POA: Diagnosis not present

## 2015-08-16 ENCOUNTER — Encounter: Payer: Self-pay | Admitting: Internal Medicine

## 2015-08-16 ENCOUNTER — Telehealth: Payer: Self-pay | Admitting: Internal Medicine

## 2015-08-16 ENCOUNTER — Ambulatory Visit (INDEPENDENT_AMBULATORY_CARE_PROVIDER_SITE_OTHER): Payer: PPO | Admitting: Internal Medicine

## 2015-08-16 DIAGNOSIS — J449 Chronic obstructive pulmonary disease, unspecified: Secondary | ICD-10-CM

## 2015-08-16 DIAGNOSIS — J45909 Unspecified asthma, uncomplicated: Secondary | ICD-10-CM

## 2015-08-16 DIAGNOSIS — E669 Obesity, unspecified: Secondary | ICD-10-CM | POA: Diagnosis not present

## 2015-08-16 DIAGNOSIS — G4733 Obstructive sleep apnea (adult) (pediatric): Secondary | ICD-10-CM

## 2015-08-16 MED ORDER — AMOXICILLIN-POT CLAVULANATE 875-125 MG PO TABS: 1.0000 | ORAL_TABLET | Freq: Two times a day (BID) | ORAL | 0 refills | Status: DC

## 2015-08-16 NOTE — Telephone Encounter (Signed)
Per CY-have patient go through his PCP. Pt is aware and will contact Dr. Merlene Laughter. Nothing more needed at this time.

## 2015-08-16 NOTE — Assessment & Plan Note (Signed)
Morbid obesity. Now on Weight Watchers and hopefully he can sustain this.

## 2015-08-16 NOTE — Patient Instructions (Signed)
Script sent for augmentin antibiotic for tooth abscess  Ok to continue [place] as discussed. We can look at AirDuo as a potentially cheeper replacement when it comes available

## 2015-08-16 NOTE — Assessment & Plan Note (Signed)
He settled on Dulera 1 puff once daily and says he feels well with this although he always has course wheezing. Pleads financial problems limiting his medication access some, although he has insurance. Rarely uses rescue inhaler and denies sleep disturbance.

## 2015-08-16 NOTE — Progress Notes (Signed)
Patient ID: [name] [name], male    DOB: 07/10/1934, 81 y.o.   MRN: [id number]  HPI   08/11/14-  54 yo M former smoker followed for asthma, allergic rhinitis, OSA,   Hx sputum Cx Tsukamurella sp ( similar to [facility]),  complicated by obesity.   PCP Dr [name]  FOLLOWS FOR: DME is [facility]-Wears BiPAP 10/3 every night. Pt states his breathing is great at this time. Using Dulera 1 puff QD instead of 2 puffs BID due to cost of medication.  02/15/2015-81 year old male former smoker followed for asthma, allergic rhinitis, OSA, history sputum Cx Tsukamurella s(p similar to [employer]),  complicated by obesity.   PCP Dr [name] FOLLOWS FOR: DME: [facility]. Wears CPAP nightly. Denies problems with pressure or mask. Pt reports no change in breathing since last OV. Using [facility] as directed BID. Pt notes some congestion in chest - Productive cough with clear/pale in color.  BiPAP 10/3/[facility] He continues BiPAP all night every night without concern. No significant lifestyle change to lose weight. Using Dulera 1 puff daily. He considers this sufficient although we started it to save money. Eating a prednisone taper about every 4 months. Using "Essential Oils" in a diffuser, which he says helps his breathing. Admits some wheeze most  08/16/2015-81 year old male former smoker followed for Asthma, allergic rhinitis, OSA, history sputum Cx Tsukamurella ( similar to [facility]),  complicated by obesity.   PCP Dr [name] BiPAP 10/3 [facility] FOLLOWS FOR: Pt continues to wear CPAP nightly for about 8-9 hours; DME: [facility]. No DL-pt is unsure if their is a SD card-will need to order DL and SD card for patient. He returned sample of Breo, saying he doesn't like the powdered inhalers. He also thought it caused leg edema with increased stasis dermatitis but his PCP blamed amlodipine. He has gone back to using Dulera 1 puff, once daily which he says is sufficient. Rarely uses rescue inhaler. Anoro didn't help.  Now on weight  watchers on advice of his PCP. New problem-he thinks he has an abscessed tooth. No regular dentist because of insurance. About to fly to [place] and asks if I could provide antibiotic. Plans to get dentist later. CXR 05/10/15 FINDINGS: There is hyperinflation of the lungs compatible with COPD. Heart is upper limits normal in size. No confluent airspace opacities or effusions. No acute bony abnormality. IMPRESSION: COPD.  No active disease. Electronically Signed   By: Charlett Nose M.D.   On: 05/10/2015 11:23 days. Breathing does not wake him. Occasional rescue inhaler.  ROS-see HPI Constitutional:   No-   weight loss, night sweats, fevers, chills, +fatigue, lassitude. HEENT:   No-  headaches, difficulty swallowing, tooth/dental problems, sore throat,       No-  sneezing, itching, ear ache, nasal congestion, post nasal drip,  CV:  No-   chest pain, orthopnea, PND, swelling in lower extremities, anasarca, dizziness, palpitations Resp: + shortness of breath with exertion or at rest.              + productive cough,  + non-productive cough,  No- coughing up of blood.              No-   change in color of mucus.  + wheezing.   Skin: No-   rash or lesions. GI:  No  heartburn, indigestion, abdominal pain, nausea, vomiting,  GU: . MS:  No-   joint pain or swelling.  . Neuro-     nothing unusual Psych:  No- change in  mood or affect. No depression or anxiety.  No memory loss.   Objective:  OBJ- Physical Exam General- Alert, Oriented, Affect-appropriate, Distress- none acute, +morbid obesity Skin- rash-none, lesions- none, excoriation- none Lymphadenopathy- none Head- atraumatic            Eyes- Gross vision intact, PERRLA, conjunctivae and secretions clear            Ears- Hearing, canals-normal            Nose- Clear, no-Septal dev, mucus, polyps, erosion, perforation             Throat- Mallampati II , mucosa clear , drainage- none, tonsils- atrophic Neck- flexible , trachea midline,  no stridor , thyroid nl, carotid no bruit Chest - symmetrical excursion , unlabored           Heart/CV- +slow pulse/Chronic, no murmur, no gallop  , no rub, nl s1 s2                           - JVD- none , edema- none, stasis changes- none, varices- none           Lung-  wheeze-+mild/unlabored with basilar rhonchi chronic, unlabored, dullness-none,                             rub- none           Chest wall-  Abd-  Br/ Gen/ Rectal- Not done, not indicated Extrem- cyanosis- none, clubbing, none, atrophy- none, strength- nl Neuro- grossly intact to observation

## 2015-08-16 NOTE — Assessment & Plan Note (Signed)
We need download as discussed with nurse.Marland Kitchen He indicates he is compliant and the quality of life is improved by BiPAP Strongly support weight loss efforts

## 2015-11-07 ENCOUNTER — Encounter: Payer: Self-pay | Admitting: Internal Medicine

## 2015-11-28 DIAGNOSIS — Z1389 Encounter for screening for other disorder: Secondary | ICD-10-CM | POA: Diagnosis not present

## 2015-11-28 DIAGNOSIS — J4540 Moderate persistent asthma, uncomplicated: Secondary | ICD-10-CM | POA: Diagnosis not present

## 2015-11-28 DIAGNOSIS — E7800 Pure hypercholesterolemia, unspecified: Secondary | ICD-10-CM | POA: Diagnosis not present

## 2015-11-28 DIAGNOSIS — I10 Essential (primary) hypertension: Secondary | ICD-10-CM | POA: Diagnosis not present

## 2015-11-28 DIAGNOSIS — E1140 Type 2 diabetes mellitus with diabetic neuropathy, unspecified: Secondary | ICD-10-CM | POA: Diagnosis not present

## 2015-11-28 DIAGNOSIS — Z0000 Encounter for general adult medical examination without abnormal findings: Secondary | ICD-10-CM | POA: Diagnosis not present

## 2015-11-28 DIAGNOSIS — Z79899 Other long term (current) drug therapy: Secondary | ICD-10-CM | POA: Diagnosis not present

## 2015-11-28 DIAGNOSIS — M109 Gout, unspecified: Secondary | ICD-10-CM | POA: Diagnosis not present

## 2015-11-28 DIAGNOSIS — G4733 Obstructive sleep apnea (adult) (pediatric): Secondary | ICD-10-CM | POA: Diagnosis not present

## 2015-11-28 DIAGNOSIS — Z6841 Body Mass Index (BMI) 40.0 and over, adult: Secondary | ICD-10-CM | POA: Diagnosis not present

## 2015-11-28 DIAGNOSIS — I77810 Thoracic aortic ectasia: Secondary | ICD-10-CM | POA: Diagnosis not present

## 2015-11-28 DIAGNOSIS — Z7984 Long term (current) use of oral hypoglycemic drugs: Secondary | ICD-10-CM | POA: Diagnosis not present

## 2015-12-12 ENCOUNTER — Telehealth: Payer: Self-pay | Admitting: Internal Medicine

## 2015-12-12 DIAGNOSIS — G4733 Obstructive sleep apnea (adult) (pediatric): Secondary | ICD-10-CM

## 2015-12-12 NOTE — Telephone Encounter (Signed)
Pt aware of that supply order has been placed to [facility]

## 2015-12-20 DIAGNOSIS — G4733 Obstructive sleep apnea (adult) (pediatric): Secondary | ICD-10-CM | POA: Diagnosis not present

## 2016-01-17 ENCOUNTER — Other Ambulatory Visit: Payer: Self-pay | Admitting: Internal Medicine

## 2016-02-01 DIAGNOSIS — L853 Xerosis cutis: Secondary | ICD-10-CM | POA: Diagnosis not present

## 2016-02-01 DIAGNOSIS — I10 Essential (primary) hypertension: Secondary | ICD-10-CM | POA: Diagnosis not present

## 2016-02-01 DIAGNOSIS — Z7984 Long term (current) use of oral hypoglycemic drugs: Secondary | ICD-10-CM | POA: Diagnosis not present

## 2016-02-01 DIAGNOSIS — Z6841 Body Mass Index (BMI) 40.0 and over, adult: Secondary | ICD-10-CM | POA: Diagnosis not present

## 2016-02-01 DIAGNOSIS — E1140 Type 2 diabetes mellitus with diabetic neuropathy, unspecified: Secondary | ICD-10-CM | POA: Diagnosis not present

## 2016-02-21 ENCOUNTER — Ambulatory Visit: Payer: PPO | Admitting: Internal Medicine

## 2016-04-09 ENCOUNTER — Ambulatory Visit (INDEPENDENT_AMBULATORY_CARE_PROVIDER_SITE_OTHER): Payer: PPO | Admitting: Internal Medicine

## 2016-04-09 ENCOUNTER — Encounter: Payer: Self-pay | Admitting: Internal Medicine

## 2016-04-09 DIAGNOSIS — J449 Chronic obstructive pulmonary disease, unspecified: Secondary | ICD-10-CM

## 2016-04-09 DIAGNOSIS — G4733 Obstructive sleep apnea (adult) (pediatric): Secondary | ICD-10-CM | POA: Diagnosis not present

## 2016-04-09 MED ORDER — FLUTICASONE-UMECLIDIN-VILANT 100-62.5-25 MCG/INH IN AEPB: 1.0000 | INHALATION_SPRAY | Freq: Every day | RESPIRATORY_TRACT | 0 refills | Status: DC

## 2016-04-09 NOTE — Progress Notes (Signed)
Pt was shown how to properly use the Trelegy inhaler. Pt had no further questions and understood.

## 2016-04-09 NOTE — Patient Instructions (Signed)
Sample x 2 Trelegy Ellipta inhaler    Inhale 1 puff then rinse mouth, once daily    Try this instead of Dulera It will probably be too expensive to stay on, but I want to see what you notice from the Trelegy.  Please call as needed

## 2016-04-09 NOTE — Progress Notes (Signed)
Patient ID: [name] [name], male    DOB: 20-Nov-1934, 82 y.o.   MRN: [id number]  HPI male former smoker followed for Asthma, allergic rhinitis, OSA, history sputum Cx Tsukamurella ( similar to [facility]),  complicated by obesity.   PCP Dr [name] ECHO 04/05/15- LVEF 60-65%, normal wall thickness, normal wall motion, dilated   ascending aorta to 4.5 cm, modearte LAE, mild RAE, normal IVC  -----------------------------------------------------------------------------------------------   08/16/2015-82 year old male former smoker followed for Asthma, allergic rhinitis, OSA, history sputum Cx Tsukamurella ( similar to [facility]),  complicated by obesity.   PCP Dr [name] BiPAP 10/3 [facility] FOLLOWS FOR: Pt continues to wear CPAP nightly for about 8-9 hours; DME: [facility]. No DL-pt is unsure if their is a SD card-will need to order DL and SD card for patient. He returned sample of Breo, saying he doesn't like the powdered inhalers. He also thought it caused leg edema with increased stasis dermatitis but his PCP blamed amlodipine. He has gone back to using Dulera 1 puff, once daily which he says is sufficient. Rarely uses rescue inhaler. Anoro didn't help.  Now on weight watchers on advice of his PCP. New problem-he thinks he has an abscessed tooth. No regular dentist because of insurance. About to fly to [place] and asks if I could provide antibiotic. Plans to get dentist later. CXR 05/10/15 FINDINGS: There is hyperinflation of the lungs compatible with COPD. Heart is upper limits normal in size. No confluent airspace opacities or effusions. No acute bony abnormality. IMPRESSION: COPD.  No active disease. Electronically Signed   By: Charlett Nose M.D.   On: 05/10/2015 11:23 days. Breathing does not wake him. Occasional rescue inhaler.  04/09/2016- 82 year old male former smoker followed for Asthma, allergic rhinitis, OSA, history sputum Cx Tsukamurella ( similar to [facility]),  complicated by  obesity. BIPAP 10/3 [facility] FOLLOWS FOR: Pt states his breathing is doing well - pt states he is taking his Dulera 1 puff once a day. Pt c/o prod cough with white to yellow mucus. Pt denies CP/tightness. Pt states his cough is worse at night.  ECHO 04/05/15- LVEF 60-65%, normal wall thickness, normal wall motion, dilated   ascending aorta to 4.5 cm, modearte LAE, mild RAE, normal IVC. Chronic bronchitic wheezy cough as usual for him. He still skin Medications Trying to [facility] but Has Preferred Dulera over Powdered Inhalers. Not Going to the Gym Now. Cold Air Is Bad for Him so He Tries to Stay in Doors. Has Tried to Lose Some Weight. Denies any reflux at all. Sputum is only white. No blood, no fever. No download available. He says he uses BiPAP all night every night and sleeps well with it  ROS-see HPI Constitutional:   No-   weight loss, night sweats, fevers, chills, +fatigue, lassitude. HEENT:   No-  headaches, difficulty swallowing, tooth/dental problems, sore throat,       No-  sneezing, itching, ear ache, nasal congestion, post nasal drip,  CV:  No-   chest pain, orthopnea, PND, swelling in lower extremities, anasarca, dizziness, palpitations Resp: + shortness of breath with exertion or at rest.              + productive cough,  + non-productive cough,  No- coughing up of blood.              No-   change in color of mucus.  + wheezing.   Skin: No-   rash or lesions. GI:  No  heartburn, indigestion, abdominal  pain, nausea, vomiting,  GU: . MS:  No-   joint pain or swelling.  . Neuro-     nothing unusual Psych:  No- change in mood or affect. No depression or anxiety.  No memory loss.   Objective:  OBJ- Physical Exam General- Alert, Oriented, Affect-appropriate, Distress- none acute, +morbid obesity But has lost some weight Skin- rash-none, lesions- none, excoriation- none Lymphadenopathy- none Head- atraumatic            Eyes- Gross vision intact, PERRLA, conjunctivae and  secretions clear            Ears- Hearing, canals-normal            Nose- Clear, no-Septal dev, mucus, polyps, erosion, perforation             Throat- Mallampati II , mucosa clear , drainage- none, tonsils- atrophic Neck- flexible , trachea midline, no stridor , thyroid nl, carotid no bruit Chest - symmetrical excursion , unlabored           Heart/CV- +slow pulse/Chronic, no murmur, no gallop  , no rub, nl s1 s2                           - JVD- none , edema- none, stasis changes- none, varices- none           Lung-  + Persistent wet bronchitic wheeze which is chronic for him., unlabored, dullness-none,                             rub- none           Chest wall-  Abd-  Br/ Gen/ Rectal- Not done, not indicated Extrem- cyanosis- none, clubbing, none, atrophy- none, strength- nl Neuro- grossly intact to observation

## 2016-04-10 NOTE — Assessment & Plan Note (Signed)
He reports good compliance and control saying he doesn't sleep without it and feels better using BiPAP.

## 2016-04-10 NOTE — Assessment & Plan Note (Signed)
We have had to minimize testing because of his finances. Watching for evidence of AB PA. Plan-try samples of [facility]. I expect push for follow-up CXR and at least an office spirometry in the future

## 2016-04-10 NOTE — Assessment & Plan Note (Signed)
He is losing some weight with weight watchers and I strongly encouraged him to stick with it.

## 2016-04-30 DIAGNOSIS — H52203 Unspecified astigmatism, bilateral: Secondary | ICD-10-CM | POA: Diagnosis not present

## 2016-04-30 DIAGNOSIS — E119 Type 2 diabetes mellitus without complications: Secondary | ICD-10-CM | POA: Diagnosis not present

## 2016-04-30 DIAGNOSIS — H353131 Nonexudative age-related macular degeneration, bilateral, early dry stage: Secondary | ICD-10-CM | POA: Diagnosis not present

## 2016-05-07 DIAGNOSIS — M5137 Other intervertebral disc degeneration, lumbosacral region: Secondary | ICD-10-CM | POA: Diagnosis not present

## 2016-05-07 DIAGNOSIS — M4727 Other spondylosis with radiculopathy, lumbosacral region: Secondary | ICD-10-CM | POA: Diagnosis not present

## 2016-05-07 DIAGNOSIS — M9904 Segmental and somatic dysfunction of sacral region: Secondary | ICD-10-CM | POA: Diagnosis not present

## 2016-05-07 DIAGNOSIS — M9903 Segmental and somatic dysfunction of lumbar region: Secondary | ICD-10-CM | POA: Diagnosis not present

## 2016-05-30 DIAGNOSIS — Z6841 Body Mass Index (BMI) 40.0 and over, adult: Secondary | ICD-10-CM | POA: Diagnosis not present

## 2016-05-30 DIAGNOSIS — I10 Essential (primary) hypertension: Secondary | ICD-10-CM | POA: Diagnosis not present

## 2016-05-30 DIAGNOSIS — Z7984 Long term (current) use of oral hypoglycemic drugs: Secondary | ICD-10-CM | POA: Diagnosis not present

## 2016-05-30 DIAGNOSIS — E1140 Type 2 diabetes mellitus with diabetic neuropathy, unspecified: Secondary | ICD-10-CM | POA: Diagnosis not present

## 2016-08-15 DIAGNOSIS — I10 Essential (primary) hypertension: Secondary | ICD-10-CM | POA: Diagnosis not present

## 2016-08-15 DIAGNOSIS — E1140 Type 2 diabetes mellitus with diabetic neuropathy, unspecified: Secondary | ICD-10-CM | POA: Diagnosis not present

## 2016-08-15 DIAGNOSIS — J4540 Moderate persistent asthma, uncomplicated: Secondary | ICD-10-CM | POA: Diagnosis not present

## 2016-10-09 ENCOUNTER — Encounter: Payer: Self-pay | Admitting: Internal Medicine

## 2016-10-09 ENCOUNTER — Encounter: Payer: Self-pay | Admitting: *Deleted

## 2016-10-10 ENCOUNTER — Ambulatory Visit (INDEPENDENT_AMBULATORY_CARE_PROVIDER_SITE_OTHER): Payer: PPO | Admitting: Internal Medicine

## 2016-10-10 ENCOUNTER — Encounter: Payer: Self-pay | Admitting: Internal Medicine

## 2016-10-10 VITALS — BP 118/74 | HR 49 | Ht 68.0 in | Wt 269.8 lb

## 2016-10-10 DIAGNOSIS — J449 Chronic obstructive pulmonary disease, unspecified: Secondary | ICD-10-CM | POA: Diagnosis not present

## 2016-10-10 DIAGNOSIS — Z23 Encounter for immunization: Secondary | ICD-10-CM

## 2016-10-10 DIAGNOSIS — G4733 Obstructive sleep apnea (adult) (pediatric): Secondary | ICD-10-CM | POA: Diagnosis not present

## 2016-10-10 DIAGNOSIS — R05 Cough: Secondary | ICD-10-CM | POA: Diagnosis not present

## 2016-10-10 MED ORDER — FLUTTER DEVI: 0 refills | Status: AC

## 2016-10-10 MED ORDER — TIOTROPIUM BROMIDE-OLODATEROL 2.5-2.5 MCG/ACT IN AERS: 2.0000 | INHALATION_SPRAY | Freq: Every day | RESPIRATORY_TRACT | 12 refills | Status: DC

## 2016-10-10 NOTE — Progress Notes (Signed)
Patient ID: [name] [name], male    DOB: 10-Jul-1934, 82 y.o.   MRN: [id number]  HPI male former smoker followed for Asthma, allergic rhinitis, OSA, history sputum Cx Tsukamurella ( similar to [facility]),  complicated by obesity.   PCP Dr [name] NPSG 03/03/96  AHI 50/ hr, desaturation to 73%, body weight 270 lbs PFT 11/22/08-mild obstruction small airways without response to bronchodilator. Air-trapping. Normal diffusion. ECHO 04/05/15- LVEF 60-65%, normal wall thickness, normal wall motion, dilated   ascending aorta to 4.5 cm, modearte LAE, mild RAE, normal IVC  -----------------------------------------------------------------------------------------------  04/09/2016- 82 year old male former smoker followed for Asthma, allergic rhinitis, OSA, history sputum Cx Tsukamurella ( similar to [facility]),  complicated by obesity. BIPAP 10/3 [facility] FOLLOWS FOR: Pt states his breathing is doing well - pt states he is taking his Dulera 1 puff once a day. Pt c/o prod cough with white to yellow mucus. Pt denies CP/tightness. Pt states his cough is worse at night.  ECHO 04/05/15- LVEF 60-65%, normal wall thickness, normal wall motion, dilated   ascending aorta to 4.5 cm, modearte LAE, mild RAE, normal IVC. Chronic bronchitic wheezy cough as usual for him. He still skin Medications Trying to [facility] but Has Preferred Dulera over Powdered Inhalers. Not Going to the Gym Now. Cold Air Is Bad for Him so He Tries to Stay in Doors. Has Tried to Lose Some Weight. Denies any reflux at all. Sputum is only white. No blood, no fever. No download available. He says he uses BiPAP all night every night and sleeps well with it  10/10/16- 82 year old male former smoker followed for Asthma, allergic rhinitis, OSA, history sputum Cx Tsukamurella ( similar to [facility]),  complicated by obesity. BIPAP 10/4 [facility]   download 87% compliance with AHI 2.4/hour Pt is doing well over all. In the middle of the night patient is up every three  hours to use the restroom. Using CPAP nightly, DME-[facility]. Pt in need new nasal pillow and supplies. Chronic sense of chest congestion. He still keeps cost down by using Dulera 1 puff daily and we discussed this. He did not like the Trelegy Ellipta inhaler and doesn't like powders. Cough productive white sputum.. Rarely uses his ProAir. Despite his age[facility] and is slowly losing some weight.  ROS-see HPI  + = positive Constitutional:   No-   weight loss, night sweats, fevers, chills, +fatigue, lassitude. HEENT:   No-  headaches, difficulty swallowing, tooth/dental problems, sore throat,       No-  sneezing, itching, ear ache, nasal congestion, post nasal drip,  CV:  No-   chest pain, orthopnea, PND, swelling in lower extremities, anasarca, dizziness, palpitations Resp: + shortness of breath with exertion or at rest.              + productive cough,  + non-productive cough,  No- coughing up of blood.              No-   change in color of mucus.  + wheezing.   Skin: No-   rash or lesions. GI:  No  heartburn, indigestion, abdominal pain, nausea, vomiting,  GU: . MS:  No-   joint pain or swelling.  . Neuro-     nothing unusual Psych:  No- change in mood or affect. No depression or anxiety.  No memory loss.   Objective:  OBJ- Physical Exam General- Alert, Oriented, Affect-appropriate, Distress- none acute, +morbid obesity But has lost  some weight Skin- rash-none, lesions- none, excoriation- none Lymphadenopathy- none Head- atraumatic            Eyes- Gross vision intact, PERRLA, conjunctivae and secretions clear            Ears- Hearing, canals-normal            Nose- Clear, no-Septal dev, mucus, polyps, erosion, perforation             Throat- Mallampati II , mucosa clear , drainage- none, tonsils- atrophic Neck- flexible , trachea midline, no stridor , thyroid nl, carotid no bruit Chest - symmetrical excursion , unlabored           Heart/CV- +slow  pulse/Chronic, no murmur, no gallop  , no rub, nl s1 s2                           - JVD- none , edema- none, stasis changes- none, varices- none           Lung-  + Persistent wet bronchitic wheeze which is chronic for him., unlabored, dullness-none,                             rub- none           Chest wall-  Abd-  Br/ Gen/ Rectal- Not done, not indicated Extrem- cyanosis- none, clubbing, none, atrophy- none, strength- nl Neuro- grossly intact to observation

## 2016-10-10 NOTE — Patient Instructions (Addendum)
Sample Dulera 200  If available        Sample and print script Stiolto inhaler     Inhale 2 puffs once daily     Try this instead of [facility]  Script printed for Flutter device     Blow through 4 times per set, 3 sets per day, when needed to help clear your airways.  Flu shot senior  Order- DME Patsy Lager- please replace worn nasal pillows mask, head gear and supplies     Ok to continue BIPAP 10/4, mask of choice humidifier, supplies airView  Dx OSA

## 2016-10-14 NOTE — Assessment & Plan Note (Signed)
Good compliance and control now.. We can continue present BiPAP settings.

## 2016-10-14 NOTE — Assessment & Plan Note (Signed)
Any weight loss is encouraged.

## 2016-10-14 NOTE — Assessment & Plan Note (Addendum)
Chronic wet rattle. Plan- Flutter device with discussion, try Stiolto

## 2016-10-22 ENCOUNTER — Telehealth: Payer: Self-pay | Admitting: Internal Medicine

## 2016-10-22 MED ORDER — AMOXICILLIN 500 MG PO CAPS: 500.0000 mg | ORAL_CAPSULE | Freq: Two times a day (BID) | ORAL | 0 refills | Status: DC

## 2016-10-22 MED ORDER — PREDNISONE 10 MG PO TABS: ORAL_TABLET | ORAL | 0 refills | Status: DC

## 2016-10-22 NOTE — Telephone Encounter (Signed)
Offer prednisone 10 mg, # 20,  4 X 2 DAYS, 3 X 2 DAYS, 2 X 2 DAYS, 1 X 2 DAYS           Amoxacillin 500 mg, # 14, 1 twice daily           Ok to also take otc cough and cold remedies for symptoms if needed

## 2016-10-22 NOTE — Telephone Encounter (Signed)
Spoke with pt. States that he is not feeling well. Reports sinus congestion, wheezing, cough, SOB and chest tightness. Cough is producing yellow mucus. Denies fever/chills/sweat. Symptoms have been ongoing per the pt. He would like to have prednisone and an antibiotic.  CY - please advise. Thanks!  No Known Allergies Current Outpatient Prescriptions on File Prior to Visit  Medication Sig Dispense Refill  . albuterol (PROAIR HFA) 108 (90 BASE) MCG/ACT inhaler Inhale 2 puffs into the lungs every 6 (six) hours as needed.      Marland Kitchen amLODipine (NORVASC) 5 MG tablet Take 1 tablet by mouth daily.    Marland Kitchen aspirin 81 MG tablet Take 81 mg by mouth daily.      Marland Kitchen atenolol (TENORMIN) 25 MG tablet Take 25 mg by mouth Daily.     . Cetirizine HCl 10 MG CAPS Take 1 capsule by mouth at bedtime.      . fluticasone (FLONASE) 50 MCG/ACT nasal spray USE 2 SPRAYS IN EACH NOSTRIL AT BEDTIME 16 g 3  . furosemide (LASIX) 40 MG tablet Take 1 tablet by mouth daily.    Marland Kitchen losartan (COZAAR) 100 MG tablet Take 100 mg by mouth daily.      . metFORMIN (GLUCOPHAGE-XR) 500 MG 24 hr tablet Take 1 tablet by mouth daily.    . mometasone-formoterol (DULERA) 200-5 MCG/ACT AERO Inhale 2 puffs into the lungs 2 (two) times daily. 2 Inhaler 0  . Multiple Vitamins-Minerals (PRESERVISION AREDS) CAPS Take 2 capsules by mouth daily.     . Omega-3 Fatty Acids (FISH OIL) 1000 MG CAPS Take by mouth. 1 in the morning and 1 at night    . pravastatin (PRAVACHOL) 80 MG tablet 80 mg Daily.    . Pseudoephedrine-Guaifenesin (MUCUS RELIEF D PO) Take by mouth. 1 in the morning and 1 at noon    . Respiratory Therapy Supplies (FLUTTER) DEVI Blow through 4 times per set, 3 wets per day 1 each 0  . Tiotropium Bromide-Olodaterol (STIOLTO RESPIMAT) 2.5-2.5 MCG/ACT AERS Inhale 2 puffs into the lungs daily. 1 Inhaler 12   No current facility-administered medications on file prior to visit.

## 2016-10-22 NOTE — Telephone Encounter (Signed)
Spoke with pt, aware of recs.  rx sent to preferred pharmacy.  Nothing further needed.  

## 2016-11-06 ENCOUNTER — Encounter: Payer: Self-pay | Admitting: Internal Medicine

## 2016-11-06 NOTE — Telephone Encounter (Signed)
Spoke with Shanda Bumps at [facility]. She took patient's name and DOB and stated that she would his info to the correct dept to check on the status of patient's order.

## 2016-12-03 DIAGNOSIS — E7800 Pure hypercholesterolemia, unspecified: Secondary | ICD-10-CM | POA: Diagnosis not present

## 2016-12-03 DIAGNOSIS — I77810 Thoracic aortic ectasia: Secondary | ICD-10-CM | POA: Diagnosis not present

## 2016-12-03 DIAGNOSIS — Z79899 Other long term (current) drug therapy: Secondary | ICD-10-CM | POA: Diagnosis not present

## 2016-12-03 DIAGNOSIS — G4733 Obstructive sleep apnea (adult) (pediatric): Secondary | ICD-10-CM | POA: Diagnosis not present

## 2016-12-03 DIAGNOSIS — Z0000 Encounter for general adult medical examination without abnormal findings: Secondary | ICD-10-CM | POA: Diagnosis not present

## 2016-12-03 DIAGNOSIS — Z6841 Body Mass Index (BMI) 40.0 and over, adult: Secondary | ICD-10-CM | POA: Diagnosis not present

## 2016-12-03 DIAGNOSIS — Z23 Encounter for immunization: Secondary | ICD-10-CM | POA: Diagnosis not present

## 2016-12-03 DIAGNOSIS — I10 Essential (primary) hypertension: Secondary | ICD-10-CM | POA: Diagnosis not present

## 2016-12-03 DIAGNOSIS — J4540 Moderate persistent asthma, uncomplicated: Secondary | ICD-10-CM | POA: Diagnosis not present

## 2016-12-03 DIAGNOSIS — E1140 Type 2 diabetes mellitus with diabetic neuropathy, unspecified: Secondary | ICD-10-CM | POA: Diagnosis not present

## 2016-12-03 DIAGNOSIS — Z7984 Long term (current) use of oral hypoglycemic drugs: Secondary | ICD-10-CM | POA: Diagnosis not present

## 2016-12-06 ENCOUNTER — Encounter: Payer: Self-pay | Admitting: Internal Medicine

## 2017-04-04 ENCOUNTER — Other Ambulatory Visit: Payer: Self-pay | Admitting: Internal Medicine

## 2017-04-08 ENCOUNTER — Encounter: Payer: Self-pay | Admitting: Internal Medicine

## 2017-04-09 ENCOUNTER — Encounter: Payer: Self-pay | Admitting: Internal Medicine

## 2017-04-09 ENCOUNTER — Ambulatory Visit (INDEPENDENT_AMBULATORY_CARE_PROVIDER_SITE_OTHER)
Admission: RE | Admit: 2017-04-09 | Discharge: 2017-04-09 | Disposition: A | Discharge status: Home / Self Care | Payer: PPO | Source: Ambulatory Visit | Attending: Internal Medicine | Admitting: Internal Medicine

## 2017-04-09 ENCOUNTER — Ambulatory Visit: Payer: PPO | Admitting: Podiatry

## 2017-04-09 ENCOUNTER — Ambulatory Visit: Payer: PPO | Admitting: Internal Medicine

## 2017-04-09 ENCOUNTER — Ambulatory Visit (INDEPENDENT_AMBULATORY_CARE_PROVIDER_SITE_OTHER): Payer: PPO

## 2017-04-09 ENCOUNTER — Encounter: Payer: Self-pay | Admitting: Podiatry

## 2017-04-09 VITALS — BP 128/84 | HR 76 | Ht 68.0 in | Wt 279.0 lb

## 2017-04-09 VITALS — BP 166/74 | HR 71 | Resp 16

## 2017-04-09 DIAGNOSIS — M7752 Other enthesopathy of left foot: Secondary | ICD-10-CM

## 2017-04-09 DIAGNOSIS — J4489 Other specified chronic obstructive pulmonary disease: Secondary | ICD-10-CM

## 2017-04-09 DIAGNOSIS — M779 Enthesopathy, unspecified: Principal | ICD-10-CM

## 2017-04-09 DIAGNOSIS — J449 Chronic obstructive pulmonary disease, unspecified: Secondary | ICD-10-CM

## 2017-04-09 DIAGNOSIS — M7750 Other enthesopathy of unspecified foot: Secondary | ICD-10-CM

## 2017-04-09 DIAGNOSIS — Q828 Other specified congenital malformations of skin: Secondary | ICD-10-CM

## 2017-04-09 DIAGNOSIS — M778 Other enthesopathies, not elsewhere classified: Secondary | ICD-10-CM

## 2017-04-09 DIAGNOSIS — L84 Corns and callosities: Secondary | ICD-10-CM

## 2017-04-09 DIAGNOSIS — G4733 Obstructive sleep apnea (adult) (pediatric): Secondary | ICD-10-CM

## 2017-04-09 DIAGNOSIS — R05 Cough: Secondary | ICD-10-CM | POA: Diagnosis not present

## 2017-04-09 MED ORDER — BUDESONIDE-FORMOTEROL FUMARATE 160-4.5 MCG/ACT IN AERO: INHALATION_SPRAY | RESPIRATORY_TRACT | 12 refills | Status: AC

## 2017-04-09 NOTE — Assessment & Plan Note (Signed)
Joint pain from gout is interfering with sleep quality and shortening his nights / BIPAP usage.  He is working with his PCP on the gout.  We discussed compliance, goals and comfort. Plan-continue BiPAP 10/4, replacing mask and supplies.

## 2017-04-09 NOTE — Assessment & Plan Note (Addendum)
Office spirometry scores are surprisingly good given the wet bronchitic cough which he always has in our visits.  He does not recognize reflux. Plan-sample trial Symbicort since we do not have samples of Dulera. CXR

## 2017-04-09 NOTE — Patient Instructions (Signed)
Samples Dulera 200 if available  Sample and print Rx for Symbicort 160     Inhale 2 puffs, then rinse mouth, twice daily  Order- Referral to Podiatry- [facility]- Dr Beverlee Nimsegal's group    Dx large callus on foot  Order- [facility]   Please replace worn mask of choice and supplies, continue BIPAP 10/4. Humidifier, AirView  Order- CXR   Dx Chronic asthmatic bronchitis moderate persistent  Order- Office spirometry

## 2017-04-09 NOTE — Progress Notes (Signed)
Subjective:  Patient ID: [name] [name], male    DOB: 05/27/1934,  MRN: [id number] HPI Chief Complaint  Patient presents with  . Foot Pain    Sub 5th MPJ left - large callused area pt just noticed, uses a roller on feet daily, area a little tender, smaller area sub 5th MPJ right     83 y.o. male presents with the above complaint.   ROS: Denies fever chills nausea vomiting muscle aches pains calf pain shortness of breath chest pain or headache.  Past Medical History:  Diagnosis Date  . Allergic rhinitis   . Asthma   . Exogenous obesity   . OSA (obstructive sleep apnea)    Past Surgical History:  Procedure Laterality Date  . COLONOSCOPY  08/29/2009   Dr. Laural Benes  . KNEE SURGERY     right    Current Outpatient Medications:  .  albuterol (PROAIR HFA) 108 (90 BASE) MCG/ACT inhaler, Inhale 2 puffs into the lungs every 6 (six) hours as needed.  , Disp: , Rfl:  .  amLODipine (NORVASC) 10 MG tablet, Take 10 mg by mouth daily., Disp: , Rfl: 3 .  aspirin 81 MG tablet, Take 81 mg by mouth daily.  , Disp: , Rfl:  .  atenolol (TENORMIN) 25 MG tablet, Take 25 mg by mouth Daily. , Disp: , Rfl:  .  budesonide-formoterol (SYMBICORT) 160-4.5 MCG/ACT inhaler, Inhale 2 puffs then rinse mouth, twice daily, Disp: 1 Inhaler, Rfl: 12 .  Cetirizine HCl 10 MG CAPS, Take 1 capsule by mouth at bedtime.  , Disp: , Rfl:  .  fluticasone (FLONASE) 50 MCG/ACT nasal spray, USE 2 SPRAYS IN EACH NOSTRIL AT BEDTIME, Disp: 16 g, Rfl: 1 .  furosemide (LASIX) 40 MG tablet, Take 1 tablet by mouth daily., Disp: , Rfl:  .  indomethacin (INDOCIN) 50 MG capsule, TAKE ONE CAPSULE BY MOUTH 3 TIMES A DAY AS NEEDED, Disp: , Rfl: 4 .  losartan (COZAAR) 100 MG tablet, Take 100 mg by mouth daily.  , Disp: , Rfl:  .  metFORMIN (GLUCOPHAGE-XR) 500 MG 24 hr tablet, Take 1 tablet by mouth daily., Disp: , Rfl:  .  mometasone-formoterol (DULERA) 200-5 MCG/ACT AERO, Inhale 2 puffs into the lungs 2 (two) times daily., Disp: 2 Inhaler,  Rfl: 0 .  Multiple Vitamins-Minerals (PRESERVISION AREDS) CAPS, Take 2 capsules by mouth daily. , Disp: , Rfl:  .  Omega-3 Fatty Acids (FISH OIL) 1000 MG CAPS, Take by mouth. 1 in the morning and 1 at night, Disp: , Rfl:  .  pravastatin (PRAVACHOL) 80 MG tablet, 80 mg Daily., Disp: , Rfl:  .  Pseudoephedrine-Guaifenesin (MUCUS RELIEF D PO), Take by mouth. 1 in the morning and 1 at noon, Disp: , Rfl:  .  Respiratory Therapy Supplies (FLUTTER) DEVI, Blow through 4 times per set, 3 wets per day, Disp: 1 each, Rfl: 0 .  rosuvastatin (CRESTOR) 20 MG tablet, Take 20 mg by mouth daily., Disp: , Rfl: 1 .  Tiotropium Bromide-Olodaterol (STIOLTO RESPIMAT) 2.5-2.5 MCG/ACT AERS, Inhale 2 puffs into the lungs daily., Disp: 1 Inhaler, Rfl: 12 .  traMADol-acetaminophen (ULTRACET) 37.5-325 MG tablet, Take 1 tablet by mouth 3 (three) times daily as needed., Disp: , Rfl: 0  No Known Allergies Review of Systems Objective:   Vitals:   04/09/17 1611  BP: (!) 166/74  Pulse: 71  Resp: 16    General: Well developed, nourished, in no acute distress, alert and oriented x3   Dermatological: Skin is warm,  dry and supple bilateral. Nails x 10 are well maintained; remaining integument appears unremarkable at this time. There are no open sores, no preulcerative lesions, no rash or signs of infection present.  Reactive to sub-fifth metatarsal head is bilateral foot.  No underlying infection.  Vascular: Dorsalis Pedis artery and Posterior Tibial artery pedal pulses are 2/4 bilateral with immedate capillary fill time. Pedal hair growth present. No varicosities and no lower extremity edema present bilateral.   Neruologic: Grossly intact via light touch bilateral. Vibratory intact via tuning fork bilateral. Protective threshold with Semmes Wienstein monofilament intact to all pedal sites bilateral. Patellar and Achilles deep tendon reflexes 2+ bilateral. No Babinski or clonus noted bilateral.   Musculoskeletal: No gross  boney pedal deformities bilateral. No pain, crepitus, or limitation noted with foot and ankle range of motion bilateral. Muscular strength 5/5 in all groups tested bilateral.  Gait: Unassisted, Nonantalgic.    Radiographs:  No acute findings.    Assessment & Plan:   Assessment: Painful calluses plantar aspect of the forefoot bilateral.   Plan: Debridement of porokeratotic lesion sub-fifth metatarsal bilateral.     Kyle Luppino T. East AltonHyatt, [place] Dakota[place]PM

## 2017-04-09 NOTE — Progress Notes (Signed)
Patient ID: [name] [name], male    DOB: 01-29-1934, 83 y.o.   MRN: [id number]  HPI male former smoker followed for Asthma, allergic rhinitis, OSA, history sputum Cx Tsukamurella ( similar to [facility]),  complicated by obesity.   PCP Dr [name] NPSG 03/03/96  AHI 50/ hr, desaturation to 73%, body weight 270 lbs PFT 11/22/08-mild obstruction small airways without response to bronchodilator. Air-trapping. Normal diffusion. ECHO 04/05/15- LVEF 60-65%, normal wall thickness, normal wall motion, dilated   ascending aorta to 4.5 cm, modearte LAE, mild RAE, normal IVC Office Spirometry 04/09/17-normal spirometry-FVC 2.99/81%, FEV1 2.30/88%, ratio 0.77, FEF 25-75% 1.99/111% ----------------------------------------------------------------------------------------------- 10/10/16- 83 year old male former smoker followed for Asthma, allergic rhinitis, OSA, history sputum Cx Tsukamurella ( similar to [facility]),  complicated by obesity. BIPAP 10/4 [facility]   download 87% compliance with AHI 2.4/hour Pt is doing well over all. In the middle of the night patient is up every three hours to use the restroom. Using CPAP nightly, DME[facility]. Pt in need new nasal pillow and supplies. Chronic sense of chest congestion. He still keeps cost down by using Dulera 1 puff daily and we discussed this. He did not like the Trelegy Ellipta inhaler and doesn't like powders. Cough productive white sputum.. Rarely uses his ProAir. Despite his age, [facility] and is slowly losing some weight.  04/09/17- 83 year old male former smoker followed for Asthma, allergic rhinitis, OSA, history sputum Cx Tsukamurella ( similar to [facility]),  complicated by obesity, gout BIPAP 10/4 [facility]   download 87% compliance with AHI 2.4/hour ----OSA- no problem with CPAP. But needing to change to sleeping in chair due to leg and back pain,Asthma- productive cough clear /yellow  BIPAP download-uses BIPAP almost every night but many short  nights because he gets up to sleep in chair.  AHI 2.6/hour. Still sleeps better with CPAP than without. Office Spirometry 04/09/17-normal spirometry-FVC 2.99/81%, FEV1 2.30/88%, ratio 0.77, FEF 25-75% 1.99/111% Stiolto didn't seem to help. Prefers Dulera 200, asking sample.  Asks about large callus on sole of foot.-We can refer him to podiatry since he does not have an upcoming PCP visit.  ROS-see HPI  + = positive Constitutional:   No-   weight loss, night sweats, fevers, chills, +fatigue, lassitude. HEENT:   No-  headaches, difficulty swallowing, tooth/dental problems, sore throat,       No-  sneezing, itching, ear ache, nasal congestion, post nasal drip,  CV:  No-   chest pain, orthopnea, PND, swelling in lower extremities, anasarca, dizziness, palpitations Resp: + shortness of breath with exertion or at rest.              + productive cough,  + non-productive cough,  No- coughing up of blood.              No-   change in color of mucus.  + wheezing.   Skin: No-   rash or lesions. GI:  No  heartburn, indigestion, abdominal pain, nausea, vomiting,  GU: . MS:  No-   joint pain or swelling.  . Neuro-     nothing unusual Psych:  No- change in mood or affect. No depression or anxiety.  No memory loss.   Objective:  OBJ- Physical Exam General- Alert, Oriented, Affect-appropriate, Distress- none acute, +morbid obesity But has lost some weight Skin- rash-none, lesions- none, excoriation- none Lymphadenopathy- none Head- atraumatic            Eyes- Gross vision intact, PERRLA, conjunctivae  and secretions clear            Ears- Hearing, canals-normal            Nose- Clear, no-Septal dev, mucus, polyps, erosion, perforation             Throat- Mallampati II , mucosa clear , drainage- none, tonsils- atrophic Neck- flexible , trachea midline, no stridor , thyroid nl, carotid no bruit Chest - symmetrical excursion , unlabored           Heart/CV- +slow pulse/Chronic, no murmur, no gallop  , no  rub, nl s1 s2                           - JVD- none , edema- none, stasis changes- none, varices- none           Lung-  + Persistent wet bronchitic wheeze which is chronic for him., unlabored, dullness-none,                             rub- none           Chest wall-  Abd-  Br/ Gen/ Rectal- Not done, not indicated Extrem- cyanosis- none, clubbing, none, atrophy- none, strength- nl Neuro- grossly intact to observation

## 2017-04-10 NOTE — Progress Notes (Signed)
Was able to talk to the patient regarding their results.  They verbalized an understanding of what was discussed. No further questions at this time. 

## 2017-06-03 DIAGNOSIS — Z79899 Other long term (current) drug therapy: Secondary | ICD-10-CM | POA: Diagnosis not present

## 2017-06-03 DIAGNOSIS — Z6841 Body Mass Index (BMI) 40.0 and over, adult: Secondary | ICD-10-CM | POA: Diagnosis not present

## 2017-06-03 DIAGNOSIS — M79605 Pain in left leg: Secondary | ICD-10-CM | POA: Diagnosis not present

## 2017-06-03 DIAGNOSIS — E1140 Type 2 diabetes mellitus with diabetic neuropathy, unspecified: Secondary | ICD-10-CM | POA: Diagnosis not present

## 2017-06-03 DIAGNOSIS — K5900 Constipation, unspecified: Secondary | ICD-10-CM | POA: Diagnosis not present

## 2017-06-03 DIAGNOSIS — Z7984 Long term (current) use of oral hypoglycemic drugs: Secondary | ICD-10-CM | POA: Diagnosis not present

## 2017-06-03 DIAGNOSIS — I10 Essential (primary) hypertension: Secondary | ICD-10-CM | POA: Diagnosis not present

## 2017-07-11 DIAGNOSIS — E1140 Type 2 diabetes mellitus with diabetic neuropathy, unspecified: Secondary | ICD-10-CM | POA: Diagnosis not present

## 2017-07-11 DIAGNOSIS — Z7984 Long term (current) use of oral hypoglycemic drugs: Secondary | ICD-10-CM | POA: Diagnosis not present

## 2017-07-11 DIAGNOSIS — J4540 Moderate persistent asthma, uncomplicated: Secondary | ICD-10-CM | POA: Diagnosis not present

## 2017-07-11 DIAGNOSIS — I10 Essential (primary) hypertension: Secondary | ICD-10-CM | POA: Diagnosis not present

## 2017-09-24 DIAGNOSIS — E1140 Type 2 diabetes mellitus with diabetic neuropathy, unspecified: Secondary | ICD-10-CM | POA: Diagnosis not present

## 2017-09-24 DIAGNOSIS — I10 Essential (primary) hypertension: Secondary | ICD-10-CM | POA: Diagnosis not present

## 2017-09-24 DIAGNOSIS — J4540 Moderate persistent asthma, uncomplicated: Secondary | ICD-10-CM | POA: Diagnosis not present

## 2017-10-10 ENCOUNTER — Ambulatory Visit: Payer: PPO | Admitting: Internal Medicine

## 2017-10-15 ENCOUNTER — Ambulatory Visit: Payer: PPO | Admitting: Internal Medicine

## 2017-10-15 ENCOUNTER — Encounter: Payer: Self-pay | Admitting: Internal Medicine

## 2017-10-15 VITALS — BP 128/78 | HR 68 | Ht 68.0 in | Wt 282.6 lb

## 2017-10-15 DIAGNOSIS — J449 Chronic obstructive pulmonary disease, unspecified: Secondary | ICD-10-CM | POA: Diagnosis not present

## 2017-10-15 DIAGNOSIS — M5440 Lumbago with sciatica, unspecified side: Secondary | ICD-10-CM

## 2017-10-15 DIAGNOSIS — G4733 Obstructive sleep apnea (adult) (pediatric): Secondary | ICD-10-CM

## 2017-10-15 DIAGNOSIS — M549 Dorsalgia, unspecified: Secondary | ICD-10-CM | POA: Insufficient documentation

## 2017-10-15 NOTE — Patient Instructions (Signed)
Return for senior/ high dose flu shot  Get back to using CPAP when you can  Sample Dulera 200 if available

## 2017-10-15 NOTE — Assessment & Plan Note (Signed)
Back and leg pains gradually interfering more.  Almost certainly reflect degenerative disc disease.  Is working with [facility].

## 2017-10-15 NOTE — Assessment & Plan Note (Signed)
Long-term pattern has not changed.  A low-grade chronic aspiration pattern is not excluded.  Finances have been limiting and he tends to seek samples. Plan-sample Dulera 200, flu shot

## 2017-10-15 NOTE — Progress Notes (Signed)
Patient ID: [name] [name], male    DOB: [birth year], 83 y.o.   MRN: [id number]  HPI male former smoker followed for Asthmatic Bronchitis, allergic rhinitis, OSA, history sputum Cx [facility]lla ( [facility]  complicated by obesity.   PCP Dr [name] NPSG 03/03/96  AHI 50/ hr, desaturation to 73%, body weight 270 lbs PFT 11/22/08-mild obstruction small airways without response to bronchodilator. Air-trapping. Normal diffusion. ECHO 04/05/15- LVEF 60-65%, normal wall thickness, normal wall motion, dilated   ascending aorta to 4.5 cm, modearte LAE, mild RAE, normal IVC Office Spirometry 04/09/17-normal spirometry-FVC 2.99/81%, FEV1 2.30/88%, ratio 0.77, FEF 25-75% 1.99/111% -----------------------------------------------------------------------------------------------  04/09/17- 83 year old male former smoker followed for Asthma, allergic rhinitis, OSA, history sputum Cx [facility]lla ( [facility]  complicated by obesity, gout BIPAP 10/4 [facility]   download 87% compliance with AHI 2.4/hour ----OSA- no problem with CPAP. But needing to change to sleeping in chair due to leg and back pain,Asthma- productive cough clear /yellow  BIPAP download-uses BIPAP almost every night but many short nights because he gets up to sleep in chair.  AHI 2.6/hour. Still sleeps better with CPAP than without. Office Spirometry 04/09/17-normal spirometry-FVC 2.99/81%, FEV1 2.30/88%, ratio 0.77, FEF 25-75% 1.99/111% Stiolto didn't seem to help. Prefers Dulera 200, asking sample.  Asks about large callus on sole of foot.-We can refer him to podiatry since he does not have an upcoming PCP visit.  10/14/2017- 83 year old male former smoker followed for Asthmatic Bronchitis, allergic rhinitis, OSA, history sputum Cx Tsukamurella ( [facility]  complicated by obesity, gout BIPAP 10/4 [facility] ----OSA: DME [facility] Pt not wearing CPAP currently as he is having issues with legs and back. Weight today 282  pounds Because of leg and back pain he is sleeping sitting up in a chair now, with no room for CPAP. Symbicort 160 or Dulera 200, Stiolto Respimat 2.5, pro-air HFA He prefers [place] but understands it is equivalent to Symbicort.  Asks a sample.  Stable chronic asthmatic bronchitis pattern with productive cough most days.  Did not like Stiolto.  Infrequent need for rescue inhaler.  Does use Mucinex.  Denies fever, chest pain, blood. CXR 04/09/2017- No active cardiopulmonary disease.  ROS-see HPI  + = positive Constitutional:   No-   weight loss, night sweats, fevers, chills, +fatigue, lassitude. HEENT:   No-  headaches, difficulty swallowing, tooth/dental problems, sore throat,       No-  sneezing, itching, ear ache, nasal congestion, post nasal drip,  CV:  No-   chest pain, orthopnea, PND, swelling in lower extremities, anasarca, dizziness, palpitations Resp: + shortness of breath with exertion or at rest.              + productive cough,  + non-productive cough,  No- coughing up of blood.              No-   change in color of mucus.  + wheezing.   Skin: No-   rash or lesions. GI:  No  heartburn, indigestion, abdominal pain, nausea, vomiting,  GU: . MS:  + joint/ back pain or swelling.  . Neuro-     nothing unusual Psych:  No- change in mood or affect. No depression or anxiety.  No memory loss.   Objective:  OBJ- Physical Exam General- Alert, Oriented, Affect-appropriate, Distress- none acute, +morbid obesity  Skin- rash-none, lesions- none, excoriation- none Lymphadenopathy- none Head- atraumatic            Eyes- Gross vision intact,  PERRLA, conjunctivae and secretions clear            Ears- Hearing, canals-normal            Nose- Clear, no-Septal dev, mucus, polyps, erosion, perforation             Throat- Mallampati II , mucosa clear , drainage- none, tonsils- atrophic Neck- flexible , trachea midline, no stridor , thyroid nl, carotid no bruit Chest - symmetrical excursion ,  unlabored           Heart/CV- +slow pulse/Chronic, no murmur, no gallop  , no rub, nl s1 s2                           - JVD- none , edema- none, stasis changes- none, varices- none           Lung-  + Persistent wet bronchitic wheeze which is chronic for him., unlabored, dullness-none,  rub- none           Chest wall-  Abd-  Br/ Gen/ Rectal- Not done, not indicated Extrem- cyanosis- none, clubbing, none, atrophy- none, strength- nl Neuro- grossly intact to observation

## 2017-10-15 NOTE — Assessment & Plan Note (Signed)
We discussed therapeutic goals of CPAP but encouraged him to work on arrangement so he can use CPAP with his recliner chair and resume use as soon as possible.

## 2017-10-18 ENCOUNTER — Ambulatory Visit (INDEPENDENT_AMBULATORY_CARE_PROVIDER_SITE_OTHER): Payer: PPO

## 2017-10-18 DIAGNOSIS — Z23 Encounter for immunization: Secondary | ICD-10-CM

## 2017-11-12 ENCOUNTER — Telehealth: Payer: Self-pay | Admitting: Internal Medicine

## 2017-11-12 NOTE — Telephone Encounter (Signed)
Stiolto and Spiriva both contain the same LAMA class drug- tiotropium. Maybe your message to me was a typo and intent was to REMOVE Stiolto and add Spiriva to [facility], which would be ok.

## 2017-11-12 NOTE — Telephone Encounter (Addendum)
Called and spoke to [place] with Dr. Laverle Hobby office, who states he seen pt this morning to discuss meds. Harrold Donath states that pt is currently taking Dulera.  Pt has taken stiolto previously, however pt did not find stiolto to be effective.  Harrold Donath is wanting to add spiriva respimat 1.25 and symbicort in place of dulera, as pt has a persistent prod cough .  CY please advise. Thanks  Current Outpatient Medications on File Prior to Visit  Medication Sig Dispense Refill  . albuterol (PROAIR HFA) 108 (90 BASE) MCG/ACT inhaler Inhale 2 puffs into the lungs every 6 (six) hours as needed.      Marland Kitchen amLODipine (NORVASC) 10 MG tablet Take 10 mg by mouth daily.  3  . aspirin 81 MG tablet Take 81 mg by mouth daily.      Marland Kitchen atenolol (TENORMIN) 25 MG tablet Take 25 mg by mouth Daily.     . budesonide-formoterol (SYMBICORT) 160-4.5 MCG/ACT inhaler Inhale 2 puffs then rinse mouth, twice daily 1 Inhaler 12  . Cetirizine HCl 10 MG CAPS Take 1 capsule by mouth at bedtime.      . fluticasone (FLONASE) 50 MCG/ACT nasal spray USE 2 SPRAYS IN EACH NOSTRIL AT BEDTIME 16 g 1  . furosemide (LASIX) 40 MG tablet Take 1 tablet by mouth daily.    . indomethacin (INDOCIN) 50 MG capsule TAKE ONE CAPSULE BY MOUTH 3 TIMES A DAY AS NEEDED  4  . losartan (COZAAR) 100 MG tablet Take 100 mg by mouth daily.      . metFORMIN (GLUCOPHAGE-XR) 500 MG 24 hr tablet Take 1 tablet by mouth daily.    . mometasone-formoterol (DULERA) 200-5 MCG/ACT AERO Inhale 2 puffs into the lungs 2 (two) times daily. 2 Inhaler 0  . Multiple Vitamins-Minerals (PRESERVISION AREDS) CAPS Take 2 capsules by mouth daily.     . Omega-3 Fatty Acids (FISH OIL) 1000 MG CAPS Take by mouth. 1 in the morning and 1 at night    . potassium chloride (K-DUR) 10 MEQ tablet Take 1 tablet by mouth daily.    . pravastatin (PRAVACHOL) 80 MG tablet 80 mg Daily.    . Pseudoephedrine-Guaifenesin (MUCUS RELIEF D PO) Take by mouth. 1 in the morning and 1 at noon    . Respiratory  Therapy Supplies (FLUTTER) DEVI Blow through 4 times per set, 3 wets per day 1 each 0  . rosuvastatin (CRESTOR) 20 MG tablet Take 20 mg by mouth daily.  1  . traMADol-acetaminophen (ULTRACET) 37.5-325 MG tablet Take 1 tablet by mouth 3 (three) times daily as needed.  0   No current facility-administered medications on file prior to visit.     No Known Allergies

## 2017-11-12 NOTE — Telephone Encounter (Signed)
Spoke to [place] to verify inhalers. Harrold Donath is requesting to add spiriva respimat 1.25 and Symbicort in place of dulera. Per CY verbally-okay to do so.  Harrold Donath has been made aware. Nothing further is needed.

## 2017-11-13 DIAGNOSIS — E1140 Type 2 diabetes mellitus with diabetic neuropathy, unspecified: Secondary | ICD-10-CM | POA: Diagnosis not present

## 2017-11-13 DIAGNOSIS — I10 Essential (primary) hypertension: Secondary | ICD-10-CM | POA: Diagnosis not present

## 2017-11-13 DIAGNOSIS — J449 Chronic obstructive pulmonary disease, unspecified: Secondary | ICD-10-CM | POA: Diagnosis not present

## 2017-11-13 DIAGNOSIS — E119 Type 2 diabetes mellitus without complications: Secondary | ICD-10-CM | POA: Diagnosis not present

## 2017-11-13 DIAGNOSIS — J4540 Moderate persistent asthma, uncomplicated: Secondary | ICD-10-CM | POA: Diagnosis not present

## 2017-12-06 DIAGNOSIS — J4540 Moderate persistent asthma, uncomplicated: Secondary | ICD-10-CM | POA: Diagnosis not present

## 2017-12-06 DIAGNOSIS — I10 Essential (primary) hypertension: Secondary | ICD-10-CM | POA: Diagnosis not present

## 2017-12-06 DIAGNOSIS — E1140 Type 2 diabetes mellitus with diabetic neuropathy, unspecified: Secondary | ICD-10-CM | POA: Diagnosis not present

## 2017-12-18 DIAGNOSIS — E7800 Pure hypercholesterolemia, unspecified: Secondary | ICD-10-CM | POA: Diagnosis not present

## 2017-12-18 DIAGNOSIS — Z0000 Encounter for general adult medical examination without abnormal findings: Secondary | ICD-10-CM | POA: Diagnosis not present

## 2017-12-18 DIAGNOSIS — I872 Venous insufficiency (chronic) (peripheral): Secondary | ICD-10-CM | POA: Diagnosis not present

## 2017-12-18 DIAGNOSIS — Z1389 Encounter for screening for other disorder: Secondary | ICD-10-CM | POA: Diagnosis not present

## 2017-12-18 DIAGNOSIS — M109 Gout, unspecified: Secondary | ICD-10-CM | POA: Diagnosis not present

## 2017-12-18 DIAGNOSIS — Z79899 Other long term (current) drug therapy: Secondary | ICD-10-CM | POA: Diagnosis not present

## 2017-12-18 DIAGNOSIS — I10 Essential (primary) hypertension: Secondary | ICD-10-CM | POA: Diagnosis not present

## 2017-12-18 DIAGNOSIS — E1140 Type 2 diabetes mellitus with diabetic neuropathy, unspecified: Secondary | ICD-10-CM | POA: Diagnosis not present

## 2017-12-18 DIAGNOSIS — G4733 Obstructive sleep apnea (adult) (pediatric): Secondary | ICD-10-CM | POA: Diagnosis not present

## 2017-12-18 DIAGNOSIS — J4540 Moderate persistent asthma, uncomplicated: Secondary | ICD-10-CM | POA: Diagnosis not present

## 2018-02-17 DIAGNOSIS — E1140 Type 2 diabetes mellitus with diabetic neuropathy, unspecified: Secondary | ICD-10-CM | POA: Diagnosis not present

## 2018-02-17 DIAGNOSIS — J4540 Moderate persistent asthma, uncomplicated: Secondary | ICD-10-CM | POA: Diagnosis not present

## 2018-02-17 DIAGNOSIS — I10 Essential (primary) hypertension: Secondary | ICD-10-CM | POA: Diagnosis not present

## 2018-03-21 DIAGNOSIS — E7800 Pure hypercholesterolemia, unspecified: Secondary | ICD-10-CM | POA: Diagnosis not present

## 2018-03-21 DIAGNOSIS — E1140 Type 2 diabetes mellitus with diabetic neuropathy, unspecified: Secondary | ICD-10-CM | POA: Diagnosis not present

## 2018-03-21 DIAGNOSIS — J4540 Moderate persistent asthma, uncomplicated: Secondary | ICD-10-CM | POA: Diagnosis not present

## 2018-03-21 DIAGNOSIS — Z7984 Long term (current) use of oral hypoglycemic drugs: Secondary | ICD-10-CM | POA: Diagnosis not present

## 2018-03-21 DIAGNOSIS — I10 Essential (primary) hypertension: Secondary | ICD-10-CM | POA: Diagnosis not present

## 2018-04-08 DIAGNOSIS — J4540 Moderate persistent asthma, uncomplicated: Secondary | ICD-10-CM | POA: Diagnosis not present

## 2018-04-08 DIAGNOSIS — I10 Essential (primary) hypertension: Secondary | ICD-10-CM | POA: Diagnosis not present

## 2018-04-08 DIAGNOSIS — E1140 Type 2 diabetes mellitus with diabetic neuropathy, unspecified: Secondary | ICD-10-CM | POA: Diagnosis not present

## 2018-04-15 ENCOUNTER — Ambulatory Visit: Payer: PPO | Admitting: Internal Medicine

## 2018-05-13 DIAGNOSIS — E1140 Type 2 diabetes mellitus with diabetic neuropathy, unspecified: Secondary | ICD-10-CM | POA: Diagnosis not present

## 2018-05-13 DIAGNOSIS — J4540 Moderate persistent asthma, uncomplicated: Secondary | ICD-10-CM | POA: Diagnosis not present

## 2018-05-13 DIAGNOSIS — I10 Essential (primary) hypertension: Secondary | ICD-10-CM | POA: Diagnosis not present

## 2018-06-12 DIAGNOSIS — I10 Essential (primary) hypertension: Secondary | ICD-10-CM | POA: Diagnosis not present

## 2018-06-12 DIAGNOSIS — J4540 Moderate persistent asthma, uncomplicated: Secondary | ICD-10-CM | POA: Diagnosis not present

## 2018-06-12 DIAGNOSIS — E1140 Type 2 diabetes mellitus with diabetic neuropathy, unspecified: Secondary | ICD-10-CM | POA: Diagnosis not present

## 2018-06-18 DIAGNOSIS — I872 Venous insufficiency (chronic) (peripheral): Secondary | ICD-10-CM | POA: Diagnosis not present

## 2018-06-18 DIAGNOSIS — Z7984 Long term (current) use of oral hypoglycemic drugs: Secondary | ICD-10-CM | POA: Diagnosis not present

## 2018-06-18 DIAGNOSIS — E1140 Type 2 diabetes mellitus with diabetic neuropathy, unspecified: Secondary | ICD-10-CM | POA: Diagnosis not present

## 2018-06-18 DIAGNOSIS — I10 Essential (primary) hypertension: Secondary | ICD-10-CM | POA: Diagnosis not present

## 2018-07-10 DIAGNOSIS — J4540 Moderate persistent asthma, uncomplicated: Secondary | ICD-10-CM | POA: Diagnosis not present

## 2018-07-10 DIAGNOSIS — I10 Essential (primary) hypertension: Secondary | ICD-10-CM | POA: Diagnosis not present

## 2018-07-10 DIAGNOSIS — E1140 Type 2 diabetes mellitus with diabetic neuropathy, unspecified: Secondary | ICD-10-CM | POA: Diagnosis not present

## 2018-08-21 DIAGNOSIS — E1140 Type 2 diabetes mellitus with diabetic neuropathy, unspecified: Secondary | ICD-10-CM | POA: Diagnosis not present

## 2018-08-21 DIAGNOSIS — Z7984 Long term (current) use of oral hypoglycemic drugs: Secondary | ICD-10-CM | POA: Diagnosis not present

## 2018-08-21 DIAGNOSIS — J4540 Moderate persistent asthma, uncomplicated: Secondary | ICD-10-CM | POA: Diagnosis not present

## 2018-08-21 DIAGNOSIS — I10 Essential (primary) hypertension: Secondary | ICD-10-CM | POA: Diagnosis not present

## 2018-09-04 DIAGNOSIS — E1140 Type 2 diabetes mellitus with diabetic neuropathy, unspecified: Secondary | ICD-10-CM | POA: Diagnosis not present

## 2018-09-04 DIAGNOSIS — J4540 Moderate persistent asthma, uncomplicated: Secondary | ICD-10-CM | POA: Diagnosis not present

## 2018-09-04 DIAGNOSIS — I10 Essential (primary) hypertension: Secondary | ICD-10-CM | POA: Diagnosis not present

## 2018-09-04 DIAGNOSIS — Z6841 Body Mass Index (BMI) 40.0 and over, adult: Secondary | ICD-10-CM | POA: Diagnosis not present

## 2018-09-04 DIAGNOSIS — Z79899 Other long term (current) drug therapy: Secondary | ICD-10-CM | POA: Diagnosis not present

## 2018-09-04 DIAGNOSIS — E7800 Pure hypercholesterolemia, unspecified: Secondary | ICD-10-CM | POA: Diagnosis not present

## 2018-09-12 DIAGNOSIS — J4540 Moderate persistent asthma, uncomplicated: Secondary | ICD-10-CM | POA: Diagnosis not present

## 2018-09-12 DIAGNOSIS — E1140 Type 2 diabetes mellitus with diabetic neuropathy, unspecified: Secondary | ICD-10-CM | POA: Diagnosis not present

## 2018-09-12 DIAGNOSIS — I10 Essential (primary) hypertension: Secondary | ICD-10-CM | POA: Diagnosis not present

## 2018-09-30 DIAGNOSIS — E119 Type 2 diabetes mellitus without complications: Secondary | ICD-10-CM | POA: Diagnosis not present

## 2018-09-30 DIAGNOSIS — H524 Presbyopia: Secondary | ICD-10-CM | POA: Diagnosis not present

## 2018-09-30 DIAGNOSIS — H353132 Nonexudative age-related macular degeneration, bilateral, intermediate dry stage: Secondary | ICD-10-CM | POA: Diagnosis not present

## 2018-09-30 DIAGNOSIS — Z961 Presence of intraocular lens: Secondary | ICD-10-CM | POA: Diagnosis not present

## 2018-10-16 DIAGNOSIS — I10 Essential (primary) hypertension: Secondary | ICD-10-CM | POA: Diagnosis not present

## 2018-10-16 DIAGNOSIS — E1140 Type 2 diabetes mellitus with diabetic neuropathy, unspecified: Secondary | ICD-10-CM | POA: Diagnosis not present

## 2018-10-16 DIAGNOSIS — J4540 Moderate persistent asthma, uncomplicated: Secondary | ICD-10-CM | POA: Diagnosis not present

## 2018-10-22 ENCOUNTER — Other Ambulatory Visit: Payer: Self-pay | Admitting: Geriatric Medicine

## 2018-10-22 ENCOUNTER — Ambulatory Visit
Admission: RE | Admit: 2018-10-22 | Discharge: 2018-10-22 | Disposition: A | Discharge status: Home / Self Care | Payer: PPO | Source: Ambulatory Visit | Attending: Geriatric Medicine | Admitting: Geriatric Medicine

## 2018-10-22 DIAGNOSIS — R269 Unspecified abnormalities of gait and mobility: Secondary | ICD-10-CM | POA: Diagnosis not present

## 2018-10-22 DIAGNOSIS — M79604 Pain in right leg: Secondary | ICD-10-CM | POA: Diagnosis not present

## 2018-10-22 DIAGNOSIS — M79605 Pain in left leg: Secondary | ICD-10-CM

## 2018-10-22 DIAGNOSIS — I10 Essential (primary) hypertension: Secondary | ICD-10-CM | POA: Diagnosis not present

## 2018-10-22 DIAGNOSIS — Z23 Encounter for immunization: Secondary | ICD-10-CM | POA: Diagnosis not present

## 2018-10-22 DIAGNOSIS — E7800 Pure hypercholesterolemia, unspecified: Secondary | ICD-10-CM | POA: Diagnosis not present

## 2018-10-23 DIAGNOSIS — M79604 Pain in right leg: Secondary | ICD-10-CM | POA: Diagnosis not present

## 2018-10-23 DIAGNOSIS — R269 Unspecified abnormalities of gait and mobility: Secondary | ICD-10-CM | POA: Diagnosis not present

## 2018-10-23 DIAGNOSIS — M79605 Pain in left leg: Secondary | ICD-10-CM | POA: Diagnosis not present

## 2018-11-11 DIAGNOSIS — I10 Essential (primary) hypertension: Secondary | ICD-10-CM | POA: Diagnosis not present

## 2018-11-11 DIAGNOSIS — E7800 Pure hypercholesterolemia, unspecified: Secondary | ICD-10-CM | POA: Diagnosis not present

## 2018-11-11 DIAGNOSIS — J4540 Moderate persistent asthma, uncomplicated: Secondary | ICD-10-CM | POA: Diagnosis not present

## 2018-11-11 DIAGNOSIS — E1140 Type 2 diabetes mellitus with diabetic neuropathy, unspecified: Secondary | ICD-10-CM | POA: Diagnosis not present

## 2018-11-17 DIAGNOSIS — M791 Myalgia, unspecified site: Secondary | ICD-10-CM | POA: Diagnosis not present

## 2018-12-03 DIAGNOSIS — E1140 Type 2 diabetes mellitus with diabetic neuropathy, unspecified: Secondary | ICD-10-CM | POA: Diagnosis not present

## 2018-12-03 DIAGNOSIS — I10 Essential (primary) hypertension: Secondary | ICD-10-CM | POA: Diagnosis not present

## 2018-12-03 DIAGNOSIS — J4540 Moderate persistent asthma, uncomplicated: Secondary | ICD-10-CM | POA: Diagnosis not present

## 2018-12-03 DIAGNOSIS — E7800 Pure hypercholesterolemia, unspecified: Secondary | ICD-10-CM | POA: Diagnosis not present

## 2019-01-19 DIAGNOSIS — I77810 Thoracic aortic ectasia: Secondary | ICD-10-CM | POA: Diagnosis not present

## 2019-01-19 DIAGNOSIS — Z79899 Other long term (current) drug therapy: Secondary | ICD-10-CM | POA: Diagnosis not present

## 2019-01-19 DIAGNOSIS — M545 Low back pain: Secondary | ICD-10-CM | POA: Diagnosis not present

## 2019-01-19 DIAGNOSIS — J4540 Moderate persistent asthma, uncomplicated: Secondary | ICD-10-CM | POA: Diagnosis not present

## 2019-01-19 DIAGNOSIS — I10 Essential (primary) hypertension: Secondary | ICD-10-CM | POA: Diagnosis not present

## 2019-01-19 DIAGNOSIS — Z1389 Encounter for screening for other disorder: Secondary | ICD-10-CM | POA: Diagnosis not present

## 2019-01-19 DIAGNOSIS — E7800 Pure hypercholesterolemia, unspecified: Secondary | ICD-10-CM | POA: Diagnosis not present

## 2019-01-19 DIAGNOSIS — E1140 Type 2 diabetes mellitus with diabetic neuropathy, unspecified: Secondary | ICD-10-CM | POA: Diagnosis not present

## 2019-01-19 DIAGNOSIS — Z0000 Encounter for general adult medical examination without abnormal findings: Secondary | ICD-10-CM | POA: Diagnosis not present

## 2019-01-19 DIAGNOSIS — E1165 Type 2 diabetes mellitus with hyperglycemia: Secondary | ICD-10-CM | POA: Diagnosis not present

## 2019-01-19 DIAGNOSIS — M79605 Pain in left leg: Secondary | ICD-10-CM | POA: Diagnosis not present

## 2019-01-19 DIAGNOSIS — G4733 Obstructive sleep apnea (adult) (pediatric): Secondary | ICD-10-CM | POA: Diagnosis not present

## 2019-01-19 DIAGNOSIS — M25552 Pain in left hip: Secondary | ICD-10-CM | POA: Diagnosis not present

## 2019-01-19 DIAGNOSIS — N401 Enlarged prostate with lower urinary tract symptoms: Secondary | ICD-10-CM | POA: Diagnosis not present

## 2019-01-20 ENCOUNTER — Other Ambulatory Visit (HOSPITAL_COMMUNITY): Payer: Self-pay | Admitting: Geriatric Medicine

## 2019-01-20 DIAGNOSIS — I77810 Thoracic aortic ectasia: Secondary | ICD-10-CM

## 2019-01-22 DIAGNOSIS — I10 Essential (primary) hypertension: Secondary | ICD-10-CM | POA: Diagnosis not present

## 2019-01-22 DIAGNOSIS — J4540 Moderate persistent asthma, uncomplicated: Secondary | ICD-10-CM | POA: Diagnosis not present

## 2019-01-22 DIAGNOSIS — E7800 Pure hypercholesterolemia, unspecified: Secondary | ICD-10-CM | POA: Diagnosis not present

## 2019-01-22 DIAGNOSIS — N401 Enlarged prostate with lower urinary tract symptoms: Secondary | ICD-10-CM | POA: Diagnosis not present

## 2019-01-22 DIAGNOSIS — E1140 Type 2 diabetes mellitus with diabetic neuropathy, unspecified: Secondary | ICD-10-CM | POA: Diagnosis not present

## 2019-01-29 ENCOUNTER — Ambulatory Visit (HOSPITAL_COMMUNITY): Payer: PPO | Attending: Cardiology

## 2019-01-29 ENCOUNTER — Other Ambulatory Visit: Payer: Self-pay

## 2019-01-29 DIAGNOSIS — I77810 Thoracic aortic ectasia: Secondary | ICD-10-CM

## 2019-01-29 DIAGNOSIS — E119 Type 2 diabetes mellitus without complications: Secondary | ICD-10-CM | POA: Diagnosis not present

## 2019-01-29 DIAGNOSIS — I119 Hypertensive heart disease without heart failure: Secondary | ICD-10-CM | POA: Diagnosis not present

## 2019-01-29 DIAGNOSIS — I712 Thoracic aortic aneurysm, without rupture: Secondary | ICD-10-CM | POA: Diagnosis not present

## 2019-01-29 DIAGNOSIS — Z87891 Personal history of nicotine dependence: Secondary | ICD-10-CM | POA: Diagnosis not present

## 2019-02-03 DIAGNOSIS — E7800 Pure hypercholesterolemia, unspecified: Secondary | ICD-10-CM | POA: Diagnosis not present

## 2019-02-03 DIAGNOSIS — J4540 Moderate persistent asthma, uncomplicated: Secondary | ICD-10-CM | POA: Diagnosis not present

## 2019-02-03 DIAGNOSIS — I10 Essential (primary) hypertension: Secondary | ICD-10-CM | POA: Diagnosis not present

## 2019-02-03 DIAGNOSIS — N401 Enlarged prostate with lower urinary tract symptoms: Secondary | ICD-10-CM | POA: Diagnosis not present

## 2019-02-03 DIAGNOSIS — E1140 Type 2 diabetes mellitus with diabetic neuropathy, unspecified: Secondary | ICD-10-CM | POA: Diagnosis not present

## 2019-03-13 DIAGNOSIS — N401 Enlarged prostate with lower urinary tract symptoms: Secondary | ICD-10-CM | POA: Diagnosis not present

## 2019-03-13 DIAGNOSIS — I10 Essential (primary) hypertension: Secondary | ICD-10-CM | POA: Diagnosis not present

## 2019-03-13 DIAGNOSIS — J4540 Moderate persistent asthma, uncomplicated: Secondary | ICD-10-CM | POA: Diagnosis not present

## 2019-03-13 DIAGNOSIS — E1140 Type 2 diabetes mellitus with diabetic neuropathy, unspecified: Secondary | ICD-10-CM | POA: Diagnosis not present

## 2019-03-13 DIAGNOSIS — E7800 Pure hypercholesterolemia, unspecified: Secondary | ICD-10-CM | POA: Diagnosis not present

## 2019-04-16 DIAGNOSIS — N401 Enlarged prostate with lower urinary tract symptoms: Secondary | ICD-10-CM | POA: Diagnosis not present

## 2019-04-16 DIAGNOSIS — J4540 Moderate persistent asthma, uncomplicated: Secondary | ICD-10-CM | POA: Diagnosis not present

## 2019-04-16 DIAGNOSIS — I10 Essential (primary) hypertension: Secondary | ICD-10-CM | POA: Diagnosis not present

## 2019-04-16 DIAGNOSIS — E7800 Pure hypercholesterolemia, unspecified: Secondary | ICD-10-CM | POA: Diagnosis not present

## 2019-04-16 DIAGNOSIS — E1140 Type 2 diabetes mellitus with diabetic neuropathy, unspecified: Secondary | ICD-10-CM | POA: Diagnosis not present

## 2019-05-07 DIAGNOSIS — N401 Enlarged prostate with lower urinary tract symptoms: Secondary | ICD-10-CM | POA: Diagnosis not present

## 2019-05-07 DIAGNOSIS — E7800 Pure hypercholesterolemia, unspecified: Secondary | ICD-10-CM | POA: Diagnosis not present

## 2019-05-07 DIAGNOSIS — I10 Essential (primary) hypertension: Secondary | ICD-10-CM | POA: Diagnosis not present

## 2019-05-07 DIAGNOSIS — J4540 Moderate persistent asthma, uncomplicated: Secondary | ICD-10-CM | POA: Diagnosis not present

## 2019-05-07 DIAGNOSIS — E1140 Type 2 diabetes mellitus with diabetic neuropathy, unspecified: Secondary | ICD-10-CM | POA: Diagnosis not present

## 2019-05-30 DIAGNOSIS — I10 Essential (primary) hypertension: Secondary | ICD-10-CM | POA: Diagnosis not present

## 2019-05-30 DIAGNOSIS — N401 Enlarged prostate with lower urinary tract symptoms: Secondary | ICD-10-CM | POA: Diagnosis not present

## 2019-05-30 DIAGNOSIS — J4540 Moderate persistent asthma, uncomplicated: Secondary | ICD-10-CM | POA: Diagnosis not present

## 2019-05-30 DIAGNOSIS — E1140 Type 2 diabetes mellitus with diabetic neuropathy, unspecified: Secondary | ICD-10-CM | POA: Diagnosis not present

## 2019-05-30 DIAGNOSIS — E7800 Pure hypercholesterolemia, unspecified: Secondary | ICD-10-CM | POA: Diagnosis not present

## 2019-07-08 DIAGNOSIS — E7800 Pure hypercholesterolemia, unspecified: Secondary | ICD-10-CM | POA: Diagnosis not present

## 2019-07-08 DIAGNOSIS — E1140 Type 2 diabetes mellitus with diabetic neuropathy, unspecified: Secondary | ICD-10-CM | POA: Diagnosis not present

## 2019-07-08 DIAGNOSIS — J4540 Moderate persistent asthma, uncomplicated: Secondary | ICD-10-CM | POA: Diagnosis not present

## 2019-07-08 DIAGNOSIS — I10 Essential (primary) hypertension: Secondary | ICD-10-CM | POA: Diagnosis not present

## 2019-07-08 DIAGNOSIS — N401 Enlarged prostate with lower urinary tract symptoms: Secondary | ICD-10-CM | POA: Diagnosis not present

## 2019-07-21 DIAGNOSIS — E669 Obesity, unspecified: Secondary | ICD-10-CM | POA: Diagnosis not present

## 2019-07-21 DIAGNOSIS — M79605 Pain in left leg: Secondary | ICD-10-CM | POA: Diagnosis not present

## 2019-07-21 DIAGNOSIS — Z79899 Other long term (current) drug therapy: Secondary | ICD-10-CM | POA: Diagnosis not present

## 2019-07-21 DIAGNOSIS — E1140 Type 2 diabetes mellitus with diabetic neuropathy, unspecified: Secondary | ICD-10-CM | POA: Diagnosis not present

## 2019-07-21 DIAGNOSIS — J4540 Moderate persistent asthma, uncomplicated: Secondary | ICD-10-CM | POA: Diagnosis not present

## 2019-07-21 DIAGNOSIS — Z7984 Long term (current) use of oral hypoglycemic drugs: Secondary | ICD-10-CM | POA: Diagnosis not present

## 2019-08-03 DIAGNOSIS — M79605 Pain in left leg: Secondary | ICD-10-CM | POA: Diagnosis not present

## 2019-08-03 DIAGNOSIS — R2681 Unsteadiness on feet: Secondary | ICD-10-CM | POA: Diagnosis not present

## 2019-08-03 DIAGNOSIS — R2689 Other abnormalities of gait and mobility: Secondary | ICD-10-CM | POA: Diagnosis not present

## 2019-08-03 DIAGNOSIS — M545 Low back pain: Secondary | ICD-10-CM | POA: Diagnosis not present

## 2019-08-04 DIAGNOSIS — I10 Essential (primary) hypertension: Secondary | ICD-10-CM | POA: Diagnosis not present

## 2019-08-04 DIAGNOSIS — E1140 Type 2 diabetes mellitus with diabetic neuropathy, unspecified: Secondary | ICD-10-CM | POA: Diagnosis not present

## 2019-08-04 DIAGNOSIS — N401 Enlarged prostate with lower urinary tract symptoms: Secondary | ICD-10-CM | POA: Diagnosis not present

## 2019-08-04 DIAGNOSIS — J4540 Moderate persistent asthma, uncomplicated: Secondary | ICD-10-CM | POA: Diagnosis not present

## 2019-08-04 DIAGNOSIS — E7800 Pure hypercholesterolemia, unspecified: Secondary | ICD-10-CM | POA: Diagnosis not present

## 2019-08-06 DIAGNOSIS — R2689 Other abnormalities of gait and mobility: Secondary | ICD-10-CM | POA: Diagnosis not present

## 2019-08-06 DIAGNOSIS — M545 Low back pain: Secondary | ICD-10-CM | POA: Diagnosis not present

## 2019-08-06 DIAGNOSIS — M79605 Pain in left leg: Secondary | ICD-10-CM | POA: Diagnosis not present

## 2019-08-06 DIAGNOSIS — R2681 Unsteadiness on feet: Secondary | ICD-10-CM | POA: Diagnosis not present

## 2019-08-11 DIAGNOSIS — M545 Low back pain: Secondary | ICD-10-CM | POA: Diagnosis not present

## 2019-08-11 DIAGNOSIS — R2689 Other abnormalities of gait and mobility: Secondary | ICD-10-CM | POA: Diagnosis not present

## 2019-08-11 DIAGNOSIS — M79605 Pain in left leg: Secondary | ICD-10-CM | POA: Diagnosis not present

## 2019-08-11 DIAGNOSIS — R2681 Unsteadiness on feet: Secondary | ICD-10-CM | POA: Diagnosis not present

## 2019-08-13 DIAGNOSIS — M545 Low back pain: Secondary | ICD-10-CM | POA: Diagnosis not present

## 2019-08-13 DIAGNOSIS — M79605 Pain in left leg: Secondary | ICD-10-CM | POA: Diagnosis not present

## 2019-08-13 DIAGNOSIS — R2681 Unsteadiness on feet: Secondary | ICD-10-CM | POA: Diagnosis not present

## 2019-08-13 DIAGNOSIS — R2689 Other abnormalities of gait and mobility: Secondary | ICD-10-CM | POA: Diagnosis not present

## 2019-08-17 DIAGNOSIS — M79605 Pain in left leg: Secondary | ICD-10-CM | POA: Diagnosis not present

## 2019-08-17 DIAGNOSIS — R2681 Unsteadiness on feet: Secondary | ICD-10-CM | POA: Diagnosis not present

## 2019-08-17 DIAGNOSIS — R2689 Other abnormalities of gait and mobility: Secondary | ICD-10-CM | POA: Diagnosis not present

## 2019-08-17 DIAGNOSIS — M545 Low back pain: Secondary | ICD-10-CM | POA: Diagnosis not present

## 2019-08-19 DIAGNOSIS — R2681 Unsteadiness on feet: Secondary | ICD-10-CM | POA: Diagnosis not present

## 2019-08-19 DIAGNOSIS — M545 Low back pain: Secondary | ICD-10-CM | POA: Diagnosis not present

## 2019-08-19 DIAGNOSIS — R2689 Other abnormalities of gait and mobility: Secondary | ICD-10-CM | POA: Diagnosis not present

## 2019-08-19 DIAGNOSIS — M79605 Pain in left leg: Secondary | ICD-10-CM | POA: Diagnosis not present

## 2019-08-26 DIAGNOSIS — M79605 Pain in left leg: Secondary | ICD-10-CM | POA: Diagnosis not present

## 2019-08-26 DIAGNOSIS — R2681 Unsteadiness on feet: Secondary | ICD-10-CM | POA: Diagnosis not present

## 2019-08-26 DIAGNOSIS — M545 Low back pain: Secondary | ICD-10-CM | POA: Diagnosis not present

## 2019-08-26 DIAGNOSIS — R2689 Other abnormalities of gait and mobility: Secondary | ICD-10-CM | POA: Diagnosis not present

## 2019-09-01 DIAGNOSIS — M79605 Pain in left leg: Secondary | ICD-10-CM | POA: Diagnosis not present

## 2019-09-01 DIAGNOSIS — R2681 Unsteadiness on feet: Secondary | ICD-10-CM | POA: Diagnosis not present

## 2019-09-01 DIAGNOSIS — R2689 Other abnormalities of gait and mobility: Secondary | ICD-10-CM | POA: Diagnosis not present

## 2019-09-01 DIAGNOSIS — M545 Low back pain: Secondary | ICD-10-CM | POA: Diagnosis not present

## 2019-09-03 DIAGNOSIS — M545 Low back pain: Secondary | ICD-10-CM | POA: Diagnosis not present

## 2019-09-03 DIAGNOSIS — R2689 Other abnormalities of gait and mobility: Secondary | ICD-10-CM | POA: Diagnosis not present

## 2019-09-03 DIAGNOSIS — R2681 Unsteadiness on feet: Secondary | ICD-10-CM | POA: Diagnosis not present

## 2019-09-03 DIAGNOSIS — M79605 Pain in left leg: Secondary | ICD-10-CM | POA: Diagnosis not present

## 2019-09-07 DIAGNOSIS — M545 Low back pain: Secondary | ICD-10-CM | POA: Diagnosis not present

## 2019-09-07 DIAGNOSIS — R2681 Unsteadiness on feet: Secondary | ICD-10-CM | POA: Diagnosis not present

## 2019-09-07 DIAGNOSIS — R2689 Other abnormalities of gait and mobility: Secondary | ICD-10-CM | POA: Diagnosis not present

## 2019-09-07 DIAGNOSIS — M79605 Pain in left leg: Secondary | ICD-10-CM | POA: Diagnosis not present

## 2019-09-09 DIAGNOSIS — R2681 Unsteadiness on feet: Secondary | ICD-10-CM | POA: Diagnosis not present

## 2019-09-09 DIAGNOSIS — E1140 Type 2 diabetes mellitus with diabetic neuropathy, unspecified: Secondary | ICD-10-CM | POA: Diagnosis not present

## 2019-09-09 DIAGNOSIS — I10 Essential (primary) hypertension: Secondary | ICD-10-CM | POA: Diagnosis not present

## 2019-09-09 DIAGNOSIS — R2689 Other abnormalities of gait and mobility: Secondary | ICD-10-CM | POA: Diagnosis not present

## 2019-09-09 DIAGNOSIS — E7800 Pure hypercholesterolemia, unspecified: Secondary | ICD-10-CM | POA: Diagnosis not present

## 2019-09-09 DIAGNOSIS — J4540 Moderate persistent asthma, uncomplicated: Secondary | ICD-10-CM | POA: Diagnosis not present

## 2019-09-09 DIAGNOSIS — M545 Low back pain: Secondary | ICD-10-CM | POA: Diagnosis not present

## 2019-09-09 DIAGNOSIS — N401 Enlarged prostate with lower urinary tract symptoms: Secondary | ICD-10-CM | POA: Diagnosis not present

## 2019-09-09 DIAGNOSIS — M79605 Pain in left leg: Secondary | ICD-10-CM | POA: Diagnosis not present

## 2019-10-02 DIAGNOSIS — H26493 Other secondary cataract, bilateral: Secondary | ICD-10-CM | POA: Diagnosis not present

## 2019-10-02 DIAGNOSIS — H353132 Nonexudative age-related macular degeneration, bilateral, intermediate dry stage: Secondary | ICD-10-CM | POA: Diagnosis not present

## 2019-10-02 DIAGNOSIS — H52203 Unspecified astigmatism, bilateral: Secondary | ICD-10-CM | POA: Diagnosis not present

## 2019-10-15 DIAGNOSIS — E7800 Pure hypercholesterolemia, unspecified: Secondary | ICD-10-CM | POA: Diagnosis not present

## 2019-10-15 DIAGNOSIS — I10 Essential (primary) hypertension: Secondary | ICD-10-CM | POA: Diagnosis not present

## 2019-10-15 DIAGNOSIS — N401 Enlarged prostate with lower urinary tract symptoms: Secondary | ICD-10-CM | POA: Diagnosis not present

## 2019-10-15 DIAGNOSIS — J4540 Moderate persistent asthma, uncomplicated: Secondary | ICD-10-CM | POA: Diagnosis not present

## 2019-10-15 DIAGNOSIS — E1140 Type 2 diabetes mellitus with diabetic neuropathy, unspecified: Secondary | ICD-10-CM | POA: Diagnosis not present

## 2019-11-05 DIAGNOSIS — J4540 Moderate persistent asthma, uncomplicated: Secondary | ICD-10-CM | POA: Diagnosis not present

## 2019-11-05 DIAGNOSIS — I10 Essential (primary) hypertension: Secondary | ICD-10-CM | POA: Diagnosis not present

## 2019-11-05 DIAGNOSIS — E7800 Pure hypercholesterolemia, unspecified: Secondary | ICD-10-CM | POA: Diagnosis not present

## 2019-11-05 DIAGNOSIS — E1140 Type 2 diabetes mellitus with diabetic neuropathy, unspecified: Secondary | ICD-10-CM | POA: Diagnosis not present

## 2019-11-05 DIAGNOSIS — N401 Enlarged prostate with lower urinary tract symptoms: Secondary | ICD-10-CM | POA: Diagnosis not present

## 2019-11-24 DIAGNOSIS — R634 Abnormal weight loss: Secondary | ICD-10-CM | POA: Diagnosis not present

## 2019-11-24 DIAGNOSIS — R197 Diarrhea, unspecified: Secondary | ICD-10-CM | POA: Diagnosis not present

## 2019-11-24 DIAGNOSIS — Z23 Encounter for immunization: Secondary | ICD-10-CM | POA: Diagnosis not present

## 2019-11-24 DIAGNOSIS — R269 Unspecified abnormalities of gait and mobility: Secondary | ICD-10-CM | POA: Diagnosis not present

## 2019-12-01 ENCOUNTER — Ambulatory Visit
Admission: RE | Admit: 2019-12-01 | Discharge: 2019-12-01 | Disposition: A | Discharge status: Home / Self Care | Payer: PPO | Source: Ambulatory Visit | Attending: Internal Medicine | Admitting: Internal Medicine

## 2019-12-01 ENCOUNTER — Other Ambulatory Visit: Payer: Self-pay | Admitting: Internal Medicine

## 2019-12-01 DIAGNOSIS — I10 Essential (primary) hypertension: Secondary | ICD-10-CM | POA: Diagnosis not present

## 2019-12-01 DIAGNOSIS — M79604 Pain in right leg: Secondary | ICD-10-CM | POA: Diagnosis not present

## 2019-12-01 DIAGNOSIS — E1140 Type 2 diabetes mellitus with diabetic neuropathy, unspecified: Secondary | ICD-10-CM | POA: Diagnosis not present

## 2019-12-01 DIAGNOSIS — R634 Abnormal weight loss: Secondary | ICD-10-CM | POA: Diagnosis not present

## 2019-12-01 DIAGNOSIS — Z79899 Other long term (current) drug therapy: Secondary | ICD-10-CM | POA: Diagnosis not present

## 2019-12-01 DIAGNOSIS — E7800 Pure hypercholesterolemia, unspecified: Secondary | ICD-10-CM | POA: Diagnosis not present

## 2019-12-01 DIAGNOSIS — R531 Weakness: Secondary | ICD-10-CM | POA: Diagnosis not present

## 2019-12-01 DIAGNOSIS — M79605 Pain in left leg: Secondary | ICD-10-CM | POA: Diagnosis not present

## 2019-12-01 DIAGNOSIS — J45909 Unspecified asthma, uncomplicated: Secondary | ICD-10-CM | POA: Diagnosis not present

## 2019-12-22 DIAGNOSIS — I10 Essential (primary) hypertension: Secondary | ICD-10-CM | POA: Diagnosis not present

## 2019-12-22 DIAGNOSIS — J4540 Moderate persistent asthma, uncomplicated: Secondary | ICD-10-CM | POA: Diagnosis not present

## 2019-12-22 DIAGNOSIS — E7800 Pure hypercholesterolemia, unspecified: Secondary | ICD-10-CM | POA: Diagnosis not present

## 2019-12-22 DIAGNOSIS — E1140 Type 2 diabetes mellitus with diabetic neuropathy, unspecified: Secondary | ICD-10-CM | POA: Diagnosis not present

## 2019-12-22 DIAGNOSIS — N401 Enlarged prostate with lower urinary tract symptoms: Secondary | ICD-10-CM | POA: Diagnosis not present

## 2020-04-22 DEATH — deceased

## 2021-01-24 IMAGING — DX DG CHEST 1V
1 series · 1 of 1 positions shown · non-contrast
Comparison: 04/09/2017

CLINICAL DATA: Weight loss, asthma

EXAM:
CHEST  1 VIEW

[view not recorded]
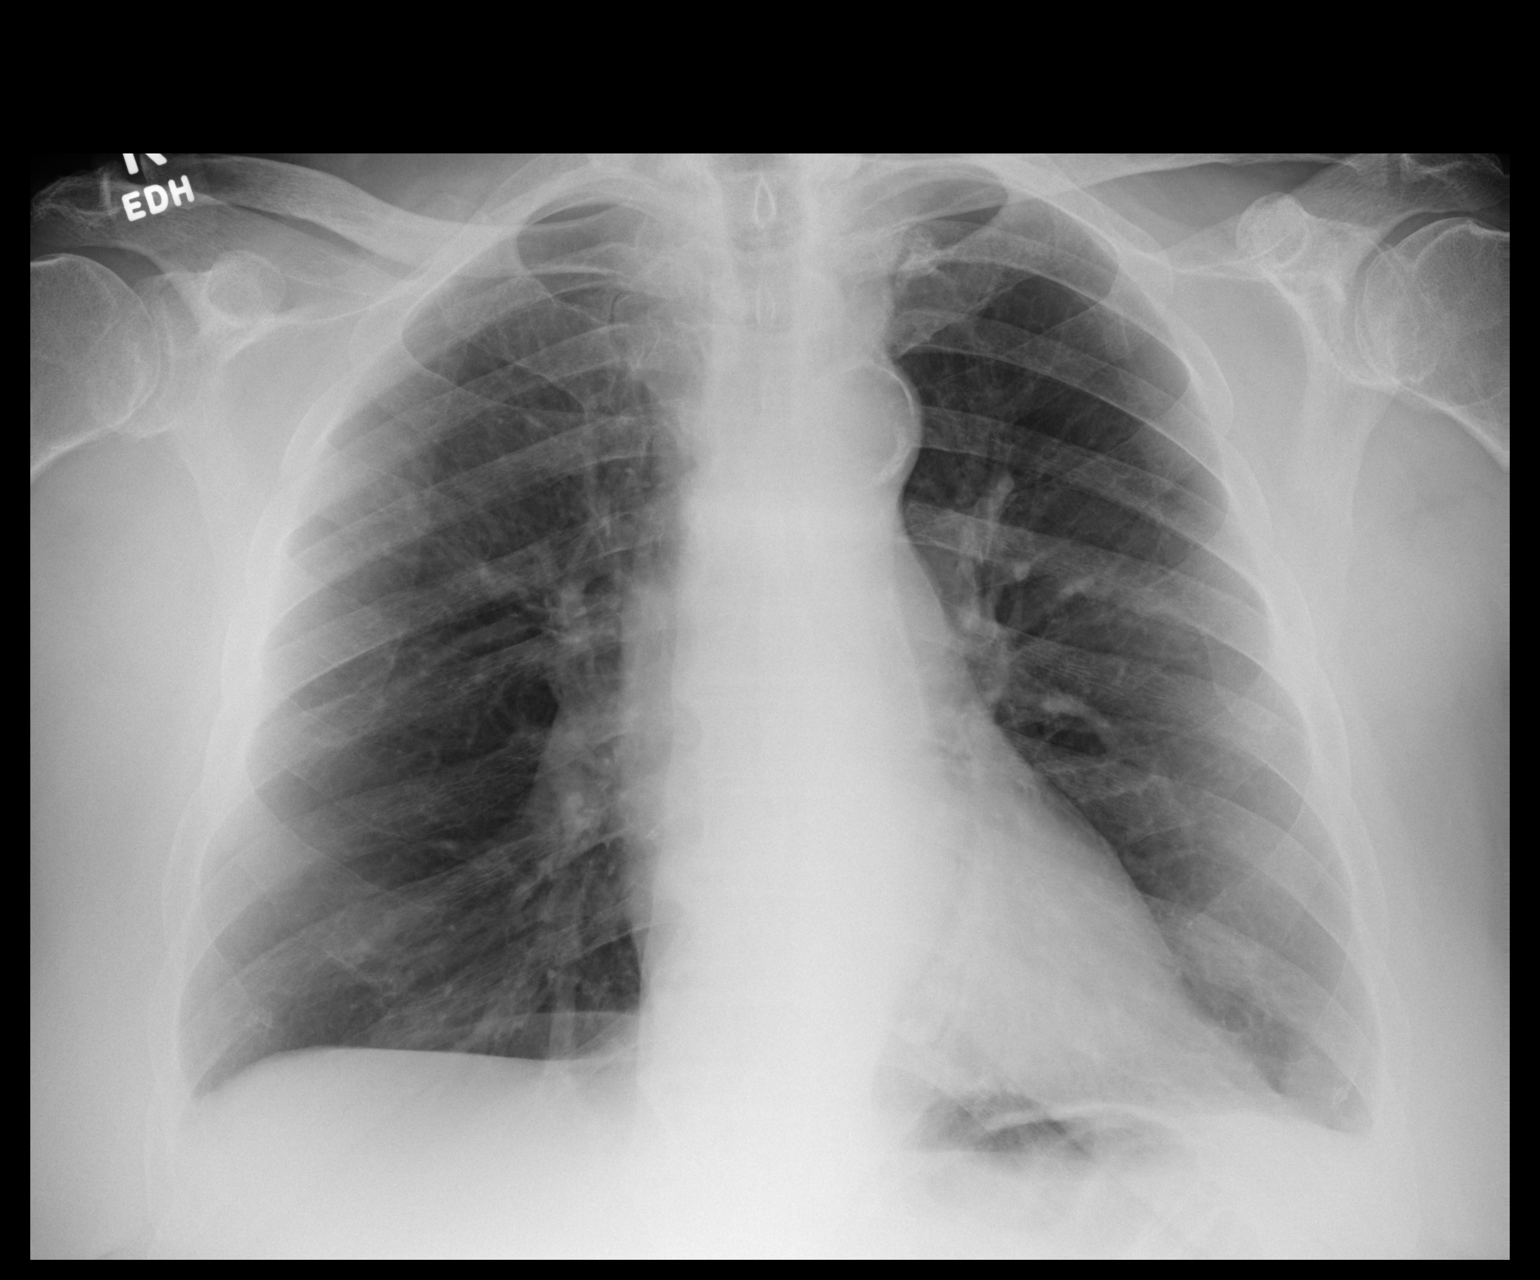

[1 of 1 positions shown; findings below may reference images not displayed]

FINDINGS: The heart size and mediastinal contours are within normal limits.
Both lungs are clear. The visualized skeletal structures are
unremarkable.
IMPRESSION: No active disease.
